# Patient Record
Sex: Female | Born: 1960
Health system: Southern US, Community
[De-identification: ages and names within clinical notes are randomized; demographics above are authoritative.]

## PROBLEM LIST (undated history)

## (undated) DIAGNOSIS — J189 Pneumonia, unspecified organism: Secondary | ICD-10-CM

## (undated) DIAGNOSIS — M1990 Unspecified osteoarthritis, unspecified site: Secondary | ICD-10-CM

## (undated) DIAGNOSIS — D649 Anemia, unspecified: Secondary | ICD-10-CM

## (undated) DIAGNOSIS — I80209 Phlebitis and thrombophlebitis of unspecified deep vessels of unspecified lower extremity: Secondary | ICD-10-CM

## (undated) DIAGNOSIS — I10 Essential (primary) hypertension: Secondary | ICD-10-CM

## (undated) DIAGNOSIS — J449 Chronic obstructive pulmonary disease, unspecified: Secondary | ICD-10-CM

## (undated) DIAGNOSIS — Z8669 Personal history of other diseases of the nervous system and sense organs: Secondary | ICD-10-CM

## (undated) HISTORY — DX: Personal history of other diseases of the nervous system and sense organs: Z86.69

## (undated) HISTORY — PX: CARPAL TUNNEL RELEASE: SHX101

## (undated) HISTORY — PX: OTHER SURGICAL HISTORY: SHX169

## (undated) HISTORY — PX: BLADDER SUSPENSION: SHX72

---

## 2016-05-22 ENCOUNTER — Ambulatory Visit (INDEPENDENT_AMBULATORY_CARE_PROVIDER_SITE_OTHER): Payer: PRIVATE HEALTH INSURANCE | Admitting: Obstetrics & Gynecology

## 2016-05-22 ENCOUNTER — Encounter: Payer: Self-pay | Admitting: Obstetrics & Gynecology

## 2016-05-22 VITALS — BP 136/90 | Ht 67.75 in | Wt 204.0 lb

## 2016-05-22 DIAGNOSIS — Z1151 Encounter for screening for human papillomavirus (HPV): Secondary | ICD-10-CM | POA: Diagnosis not present

## 2016-05-22 DIAGNOSIS — F17200 Nicotine dependence, unspecified, uncomplicated: Secondary | ICD-10-CM

## 2016-05-22 DIAGNOSIS — N898 Other specified noninflammatory disorders of vagina: Secondary | ICD-10-CM | POA: Diagnosis not present

## 2016-05-22 DIAGNOSIS — Z01411 Encounter for gynecological examination (general) (routine) with abnormal findings: Secondary | ICD-10-CM | POA: Diagnosis not present

## 2016-05-22 LAB — URINALYSIS W MICROSCOPIC + REFLEX CULTURE
Bilirubin Urine: NEGATIVE
CRYSTALS: NONE SEEN [HPF]
Casts: NONE SEEN [LPF]
Glucose, UA: NEGATIVE
Leukocytes, UA: NEGATIVE
Nitrite: NEGATIVE
Specific Gravity, Urine: 1.02 (ref 1.001–1.035)
YEAST: NONE SEEN [HPF]
pH: 5.5 (ref 5.0–8.0)

## 2016-05-22 LAB — WET PREP FOR TRICH, YEAST, CLUE
Clue Cells Wet Prep HPF POC: NONE SEEN
Trich, Wet Prep: NONE SEEN
Yeast Wet Prep HPF POC: NONE SEEN

## 2016-05-22 MED ORDER — TINIDAZOLE 500 MG PO TABS: 2.0000 g | ORAL_TABLET | Freq: Every day | ORAL | 0 refills | Status: AC

## 2016-05-22 NOTE — Addendum Note (Signed)
Addended by: Thurnell Garbe A on: 05/22/2016 11:50 AM   Modules accepted: Orders

## 2016-05-22 NOTE — Patient Instructions (Signed)
Your Annual/Gyn exam was normal today except for a Mild Bacterial Vaginosis.  Tindamax was sent to your pharmacy for treatment.  A Pap/HPV HR was done and I will give you the result as soon as available.  Please schedule your screening Mammo as soon as possible.  Congratulation on your plan to quit smoking and loosing weight. Anna Roth, it was a pleasure to meet you today!

## 2016-05-22 NOTE — Progress Notes (Signed)
Anna Roth 18-Nov-1960 [id number]   History:    56 y.o. G4P4L4  Married.  11 grand-children.  Moved from [place] to take care of her mom here who has dementia.  New patient presenting for annual gyn exam.  Menopause.  No HRT.  No PMB.  Mild hot flashes, tolerable.  No pelvic pain.  C/O vaginal d/c with itching and odor currently.  Mild SUI, Kegels PRN.  Breasts wnl.  BMI 31.3  Past medical history,surgical history, family history and social history were all reviewed and documented in the EPIC chart.  Gynecologic History No LMP recorded. Patient is postmenopausal. Contraception: post menopausal status Last Pap: 5 yrs ago. Results were: normal Last mammogram: 5 yrs ago. Results were: normal  Obstetric History OB History  Gravida Para Term Preterm AB Living  '4 4       4  '$ SAB TAB Ectopic Multiple Live Births               # Outcome Date GA Lbr Len/2nd Weight Sex Delivery Anes PTL Lv  4 Para           3 Para           2 Para           1 Para                ROS: A ROS was performed and pertinent positives and negatives are included in the history.  GENERAL: No fevers or chills. HEENT: No change in vision, no earache, sore throat or sinus congestion. NECK: No pain or stiffness. CARDIOVASCULAR: No chest pain or pressure. No palpitations. PULMONARY: No shortness of breath, cough or wheeze. GASTROINTESTINAL: No abdominal pain, nausea, vomiting or diarrhea, melena or bright red blood per rectum. GENITOURINARY: No urinary frequency, urgency, hesitancy or dysuria. MUSCULOSKELETAL: No joint or muscle pain, no back pain, no recent trauma. DERMATOLOGIC: No rash, no itching, no lesions. ENDOCRINE: No polyuria, polydipsia, no heat or cold intolerance. No recent change in weight. HEMATOLOGICAL: No anemia or easy bruising or bleeding. NEUROLOGIC: No headache, seizures, numbness, tingling or weakness. PSYCHIATRIC: No depression, no loss of interest in normal activity or change in sleep pattern.      Exam:   BP 136/90   Ht 5' 7.75" (1.721 m)   Wt 204 lb (92.5 kg)   BMI 31.25 kg/m   Body mass index is 31.25 kg/m.  General appearance : Well developed well nourished female. No acute distress HEENT: Eyes: no retinal hemorrhage or exudates,  Neck supple, trachea midline, no carotid bruits, no thyroidmegaly Lungs: Clear to auscultation, no rhonchi or wheezes, or rib retractions  Heart: Regular rate and rhythm, no murmurs or gallops Breast:Examined in sitting and supine position were symmetrical in appearance, no palpable masses or tenderness,  no skin retraction, no nipple inversion, no nipple discharge, no skin discoloration, no axillary or supraclavicular lymphadenopathy Abdomen: no palpable masses or tenderness, no rebound or guarding Extremities: no edema or skin discoloration or tenderness  Pelvic:  Bartholin, Urethra, Skene Glands: Within normal limits             Vagina: No gross lesions.  Vaginal discharge mildly increased, wet prep done.  Cervix: No gross lesions or discharge.  Pap done.    Uterus  AV, normal size, shape and consistency, non-tender and mobile  Adnexa  Without masses or tenderness  Anus and perineum  normal      Assessment/Plan:  56 y.o. female for annual exam  1. Encounter for gynecological examination with abnormal finding Normal Gyn exam except findings below.  Request to schedule Mammo given. - PAP,TP IMGw/HPV RNA,rflx [id number],57/26  2. Vaginal discharge Mild BV.  Tindamax treatment sent.  3. Morbid obesity ([place]) Low Carb, [employer] and physical activity reviewed.  4. Smoker Counseling done on importance of quitting.  Plans to quit in May 2018 when starts working.  5. Special screening examination for human papillomavirus (HPV)  - PAP,TP IMGw/HPV RNA,rflx [id number],63/84  Counseling on above issues >50% x 20 min.   Princess Bruins MD, 9:54 AM 05/22/2016

## 2016-05-24 LAB — URINE CULTURE

## 2016-05-26 LAB — PAP, TP IMAGING W/ HPV RNA, RFLX HPV TYPE 16,18/45: HPV mRNA, High Risk: NOT DETECTED

## 2016-05-27 ENCOUNTER — Ambulatory Visit (INDEPENDENT_AMBULATORY_CARE_PROVIDER_SITE_OTHER): Payer: PRIVATE HEALTH INSURANCE | Admitting: Physician Assistant

## 2016-05-27 ENCOUNTER — Encounter: Payer: Self-pay | Admitting: Physician Assistant

## 2016-05-27 VITALS — BP 130/84 | HR 82 | Temp 98.1°F | Resp 16 | Ht 67.5 in | Wt 203.2 lb

## 2016-05-27 DIAGNOSIS — Z789 Other specified health status: Secondary | ICD-10-CM

## 2016-05-27 DIAGNOSIS — Z1211 Encounter for screening for malignant neoplasm of colon: Secondary | ICD-10-CM

## 2016-05-27 DIAGNOSIS — Z13228 Encounter for screening for other metabolic disorders: Secondary | ICD-10-CM | POA: Diagnosis not present

## 2016-05-27 DIAGNOSIS — Z0000 Encounter for general adult medical examination without abnormal findings: Secondary | ICD-10-CM | POA: Diagnosis not present

## 2016-05-27 DIAGNOSIS — Z1159 Encounter for screening for other viral diseases: Secondary | ICD-10-CM

## 2016-05-27 DIAGNOSIS — Z113 Encounter for screening for infections with a predominantly sexual mode of transmission: Secondary | ICD-10-CM | POA: Diagnosis not present

## 2016-05-27 DIAGNOSIS — Z130 Encounter for screening for diseases of the blood and blood-forming organs and certain disorders involving the immune mechanism: Secondary | ICD-10-CM | POA: Diagnosis not present

## 2016-05-27 DIAGNOSIS — Z1329 Encounter for screening for other suspected endocrine disorder: Secondary | ICD-10-CM | POA: Diagnosis not present

## 2016-05-27 DIAGNOSIS — Z23 Encounter for immunization: Secondary | ICD-10-CM | POA: Diagnosis not present

## 2016-05-27 DIAGNOSIS — Z13220 Encounter for screening for lipoid disorders: Secondary | ICD-10-CM

## 2016-05-27 NOTE — Patient Instructions (Addendum)
IF you received an x-ray today, you will receive an invoice from [facility]. Please contact [facility] at [phone] with questions or concerns regarding your invoice.   IF you received labwork today, you will receive an invoice from [facility]. Please contact [facility] at [phone] with questions or concerns regarding your invoice.   Our billing staff will not be able to assist you with questions regarding bills from these companies.  You will be contacted with the lab results as soon as they are available. The fastest way to get your results is to activate your My Chart account. Instructions are located on the last page of this paperwork. If you have not heard from [place] regarding the results in 2 weeks, please contact this office.   Please hydrate well with 64oz of water if not more. You can try melatonin at night.  This is over the counter.  It can help with sleep.  Also exercise can help.  Taking a brisk walk for 30 minutes 4 times per week.    Please await contact for colonoscopy referral.   Keeping You Healthy  Get These Tests  Blood Pressure- Have your blood pressure checked by your healthcare provider at least once a year.  Normal blood pressure is 120/80.  Weight- Have your body mass index (BMI) calculated to screen for obesity.  BMI is a measure of body fat based on height and weight.  You can calculate your own BMI at [employer]velB[employer].it  Cholesterol- Have your cholesterol checked every year.  Diabetes- Have your blood sugar checked every year if you have high blood pressure, high cholesterol, a family history of diabetes or if you are overweight.  Pap Test - Have a pap test every 1 to 5 years if you have been sexually active.  If you are older than 65 and recent pap tests have been normal you may not need additional pap tests.  In addition, if you have had a hysterectomy  for benign disease additional pap tests are not  necessary.  Mammogram-Yearly mammograms are essential for early detection of breast cancer  Screening for Colon Cancer- Colonoscopy starting at age 25. Screening may begin sooner depending on your family history and other health conditions.  Follow up colonoscopy as directed by your Gastroenterologist.  Screening for Osteoporosis- Screening begins at age 28 with bone density scanning, sooner if you are at higher risk for developing Osteoporosis.  Get these medicines  Calcium with Vitamin D- Your body requires 1200-1500 mg of Calcium a day and [phone] IU of Vitamin D a day.  You can only absorb 500 mg of Calcium at a time therefore Calcium must be taken in 2 or 3 separate doses throughout the day.  Hormones- Hormone therapy has been associated with increased risk for certain cancers and heart disease.  Talk to your healthcare provider about if you need relief from menopausal symptoms.  Aspirin- Ask your healthcare provider about taking Aspirin to prevent Heart Disease and Stroke.  Get these Immuniztions  Flu shot- Every fall  Pneumonia shot- Once after the age of 58; if you are younger ask your healthcare provider if you need a pneumonia shot.  Tetanus- Every ten years.  Zostavax- Once after the age of 62 to prevent shingles.  Take these steps  Don't smoke- Your healthcare provider can help you quit. For tips on how to quit, ask your healthcare provider or go to www.smokefree.gov or call 1-800 QUIT-NOW.  Be physically active- Exercise 5 days a  week for a minimum of 30 minutes.  If you are not already physically active, start slow and gradually work up to 30 minutes of moderate physical activity.  Try walking, dancing, bike riding, swimming, etc.  Eat a healthy diet- Eat a variety of healthy foods such as fruits, vegetables, whole grains, low fat milk, low fat cheeses, yogurt, lean meats, chicken, fish, eggs, dried beans, tofu, etc.  For more information go to  www.thenutritionsource.org  Dental visit- Brush and floss teeth twice daily; visit your dentist twice a year.  Eye exam- Visit your Optometrist or Ophthalmologist yearly.  Drink alcohol in moderation- Limit alcohol intake to one drink or less a day.  Never drink and drive.  Depression- Your emotional health is as important as your physical health.  If you're feeling down or losing interest in things you normally enjoy, please talk to your healthcare provider.  Seat Belts- can save your life; always wear one  Smoke/Carbon Monoxide detectors- These detectors need to be installed on the appropriate level of your home.  Replace batteries at least once a year.  Violence- If anyone is threatening or hurting you, please tell your healthcare provider.  Living Will/ Health care power of attorney- Discuss with your healthcare provider and family.

## 2016-05-27 NOTE — Progress Notes (Signed)
PRIMARY CARE AT [facility] [address] [place] [address] [phone]  Date:  05/27/2016   Name:  Anna Roth   DOB:  Jul 24, 1960   MRN:  [id number]  PCP:  No PCP Per Patient    History of Present Illness:  Anna Roth is a 56 y.o. female patient who presents to PCP with  Chief Complaint  Patient presents with  . Annual Exam    varicella titer, seen GYN last wk, form to fill out     --DIET: she may eat hotdogs, avoids fried foods, salads.  Vegetables in daily; peas, carrots, green beans, spinach  --BOWEL MOVEMENTS: irregular, can not hold bowels, seems like she is not going at all, but has soft stools cup of coffee.  No blood or black stool.  She has noticed a white discharge like fat.  This has started for the last 2 months.  No fevers.    --URINATION: no frequency, hematuria, or dysuria  --colonoscopy in [place] likely 5 years ago.  Polyps.    --she has a hx of high cholesterol.  Was taking atorvastatin, 67m.  --SLEEP: she has a lot on her mind, so she is having difficulty sleeping 1am-4am.    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  She likes to golf for fun.  Go for long rides, and reading are also fun for her.   Exercise: none at this time.  --etOH: glass of wine daily, and cocktail weekends --tobacco or vaping: quit for 5 years, quit date is 57/05/1023   --illicit drug use: none  --she was told that she had a possible goiter.    There are no active problems to display for this patient.   Past Medical History:  Diagnosis Date  . H/O: Bell's palsy     Past Surgical History:  Procedure Laterality Date  . BLADDER SUSPENSION    . CARPAL TUNNEL RELEASE Bilateral   . neck fusion     x4    Social History  Substance Use Topics  . Smoking status: Current Every Day Smoker    Packs/day: 0.50  . Smokeless tobacco: Never Used  . Alcohol use Yes     Comment: glass of wine with dinner    Family History   Problem Relation Age of Onset  . Diabetes Mother   . Stroke Sister   . Cancer Paternal Aunt     stomach  . Cancer Maternal Grandmother     stomach  . Cancer Paternal Grandmother     stomach     No Known Allergies  Medication list has been reviewed and updated.  No current outpatient prescriptions on file prior to visit.   No current facility-administered medications on file prior to visit.     Review of Systems  Constitutional: Negative for chills and fever.  HENT: Negative for ear discharge, ear pain and sore throat.   Eyes: Negative for blurred vision and double vision.  Respiratory: Negative for cough, shortness of breath and wheezing.   Cardiovascular: Negative for chest pain, palpitations and leg swelling.  Gastrointestinal: Negative for diarrhea, nausea and vomiting.  Genitourinary: Negative for dysuria, frequency and hematuria.  Skin: Negative for itching and rash.  Neurological: Negative for dizziness and headaches.   ROS otherwise unremarkable unless listed above.  Physical Examination: BP 130/84 (BP Location: Left Arm, Cuff Size: Large)   Pulse 82   Temp 98.1 F (36.7 C) (Oral)   Resp 16  Ht 5' 7.5" (1.715 m)   Wt 203 lb 3.2 oz (92.2 kg)   SpO2 100%   BMI 31.36 kg/m  Ideal Body Weight: Weight in (lb) to have BMI = 25: 161.7  Physical Exam  Constitutional: She is oriented to person, place, and time. She appears well-developed and well-nourished. No distress.  HENT:  Head: Normocephalic and atraumatic.  Right Ear: Tympanic membrane, external ear and ear canal normal.  Left Ear: Tympanic membrane, external ear and ear canal normal.  Nose: Right sinus exhibits no maxillary sinus tenderness and no frontal sinus tenderness. Left sinus exhibits no maxillary sinus tenderness and no frontal sinus tenderness.  Mouth/Throat: Oropharynx is clear and moist. No uvula swelling. No oropharyngeal exudate, posterior oropharyngeal edema or posterior oropharyngeal  erythema.  Eyes: Conjunctivae and EOM are normal. Pupils are equal, round, and reactive to light.  Neck: Normal range of motion. Neck supple. No thyromegaly present.  Cardiovascular: Normal rate, regular rhythm, normal heart sounds and intact distal pulses.  Exam reveals no gallop, no distant heart sounds and no friction rub.   No murmur heard. Pulmonary/Chest: Effort normal and breath sounds normal. No respiratory distress. She has no decreased breath sounds. She has no wheezes. She has no rhonchi.  Abdominal: Soft. Bowel sounds are normal. She exhibits no distension and no mass. There is no tenderness.  Musculoskeletal: Normal range of motion. She exhibits no edema or tenderness.  Lymphadenopathy:       Head (right side): No submandibular, no tonsillar, no preauricular and no posterior auricular adenopathy present.       Head (left side): No submandibular, no tonsillar, no preauricular and no posterior auricular adenopathy present.    She has no cervical adenopathy.  Neurological: She is alert and oriented to person, place, and time. No cranial nerve deficit. She exhibits normal muscle tone. Coordination normal.  Skin: Skin is warm and dry. She is not diaphoretic.  Psychiatric: She has a normal mood and affect. Her behavior is normal.     Assessment and Plan: Anna Roth is a 56 y.o. female who is here today for annual physical exam, form completion, and establish care. She will return for her hep b and in 6 months from 05/06/2016 for 3rd dosing. Referral for colonoscopy performed today.  Annual physical exam - Plan: CBC, CMP14+EGFR, Lipid panel, RPR, HIV antibody, Hepatitis C antibody, Varicella Zoster Abs, IgG/IgM, TSH, T4, Free, Ambulatory referral to Gastroenterology, Hepatitis B vaccine adult IM, CANCELED: Hepatitis B vaccine adult IM  Screening for deficiency anemia - Plan: CBC  Screening for metabolic disorder - Plan: CMP14+EGFR  Screening for lipid disorders - Plan: Lipid  panel  Screening for STD (sexually transmitted disease) - Plan: RPR, HIV antibody, Hepatitis C antibody  Varicella vaccination status unknown - Plan: Varicella Zoster Abs, IgG/IgM  Screening for thyroid disorder - Plan: TSH, T4, Free  Need for hepatitis C screening test - Plan: Hepatitis C antibody  Special screening for malignant neoplasms, colon - Plan: Ambulatory referral to Gastroenterology  Need for hepatitis B vaccination - Plan: Hepatitis B vaccine adult IM, CANCELED: Hepatitis B vaccine adult IM  Ivar Drape, PA-C [facility] 5/2/20181:50 PM

## 2016-05-28 LAB — CMP14+EGFR
A/G RATIO: 1.6 (ref 1.2–2.2)
ALT: 19 IU/L (ref 0–32)
AST: 17 IU/L (ref 0–40)
Albumin: 4.7 g/dL (ref 3.5–5.5)
Alkaline Phosphatase: 106 IU/L (ref 39–117)
BUN/Creatinine Ratio: 12 (ref 9–23)
BUN: 9 mg/dL (ref 6–24)
Bilirubin Total: 0.3 mg/dL (ref 0.0–1.2)
CALCIUM: 10 mg/dL (ref 8.7–10.2)
CHLORIDE: 100 mmol/L (ref 96–106)
CO2: 22 mmol/L (ref 18–29)
Creatinine, Ser: 0.74 mg/dL (ref 0.57–1.00)
GFR calc Af Amer: 105 mL/min/{1.73_m2} (ref >59)
GFR calc non Af Amer: 91 mL/min/{1.73_m2} (ref >59)
GLUCOSE: 93 mg/dL (ref 65–99)
Globulin, Total: 3 g/dL (ref 1.5–4.5)
POTASSIUM: 4.1 mmol/L (ref 3.5–5.2)
Sodium: 140 mmol/L (ref 134–144)
Total Protein: 7.7 g/dL (ref 6.0–8.5)

## 2016-05-28 LAB — VARICELLA ZOSTER ABS, IGG/IGM: Varicella zoster IgG: 972 index (ref >165)

## 2016-05-28 LAB — CBC
Hematocrit: 46.7 % — ABNORMAL HIGH (ref 34.0–46.6)
Hemoglobin: 15.4 g/dL (ref 11.1–15.9)
MCH: 27.2 pg (ref 26.6–33.0)
MCHC: 33 g/dL (ref 31.5–35.7)
MCV: 82 fL (ref 79–97)
PLATELETS: 248 10*3/uL (ref 150–379)
RBC: 5.67 x10E6/uL — ABNORMAL HIGH (ref 3.77–5.28)
RDW: 14.6 % (ref 12.3–15.4)
WBC: 8.9 10*3/uL (ref 3.4–10.8)

## 2016-05-28 LAB — LIPID PANEL
Chol/HDL Ratio: 6.2 ratio — ABNORMAL HIGH (ref 0.0–4.4)
Cholesterol, Total: 354 mg/dL — ABNORMAL HIGH (ref 100–199)
HDL: 57 mg/dL (ref >39)
LDL Calculated: 252 mg/dL — ABNORMAL HIGH (ref 0–99)
TRIGLYCERIDES: 225 mg/dL — AB (ref 0–149)
VLDL CHOLESTEROL CAL: 45 mg/dL — AB (ref 5–40)

## 2016-05-28 LAB — TSH: TSH: 2.03 u[IU]/mL (ref 0.450–4.500)

## 2016-05-28 LAB — HIV ANTIBODY (ROUTINE TESTING W REFLEX): HIV Screen 4th Generation wRfx: NONREACTIVE

## 2016-05-28 LAB — RPR: RPR Ser Ql: NONREACTIVE

## 2016-05-28 LAB — HEPATITIS C ANTIBODY

## 2016-05-28 LAB — T4, FREE: FREE T4: 1.32 ng/dL (ref 0.82–1.77)

## 2016-05-29 ENCOUNTER — Telehealth: Payer: Self-pay | Admitting: Physician Assistant

## 2016-05-29 NOTE — Telephone Encounter (Signed)
Pt has brought in paperwork for [place] [place] to fill out. I have put in nurse's box at [phone]. Please advise pt when ready.

## 2016-05-29 NOTE — Telephone Encounter (Signed)
Filled out and up front for pick up Copy sent to scan I called patient but no answer

## 2016-06-04 ENCOUNTER — Other Ambulatory Visit (INDEPENDENT_AMBULATORY_CARE_PROVIDER_SITE_OTHER): Payer: PRIVATE HEALTH INSURANCE | Admitting: Urgent Care

## 2016-06-04 DIAGNOSIS — Z23 Encounter for immunization: Secondary | ICD-10-CM

## 2016-06-04 NOTE — Patient Instructions (Signed)
Patient came in today for her 2nd Hepatitis B vaccine. It was given in Left deltoid.

## 2016-08-29 ENCOUNTER — Encounter: Payer: Self-pay | Admitting: Physician Assistant

## 2017-04-28 ENCOUNTER — Encounter: Payer: Self-pay | Admitting: Physician Assistant

## 2019-03-18 ENCOUNTER — Ambulatory Visit: Payer: Self-pay

## 2019-04-11 ENCOUNTER — Encounter (HOSPITAL_COMMUNITY): Payer: Self-pay | Admitting: Emergency Medicine

## 2019-04-11 ENCOUNTER — Emergency Department (HOSPITAL_COMMUNITY)
Admission: EM | Admit: 2019-04-11 | Discharge: 2019-04-11 | Disposition: A | Discharge status: Home / Self Care | Payer: BC Managed Care – PPO | Attending: Emergency Medicine | Admitting: Emergency Medicine

## 2019-04-11 ENCOUNTER — Other Ambulatory Visit: Payer: Self-pay

## 2019-04-11 ENCOUNTER — Emergency Department (HOSPITAL_COMMUNITY): Payer: BC Managed Care – PPO

## 2019-04-11 DIAGNOSIS — F17210 Nicotine dependence, cigarettes, uncomplicated: Secondary | ICD-10-CM | POA: Insufficient documentation

## 2019-04-11 DIAGNOSIS — J441 Chronic obstructive pulmonary disease with (acute) exacerbation: Secondary | ICD-10-CM | POA: Diagnosis not present

## 2019-04-11 DIAGNOSIS — Z20822 Contact with and (suspected) exposure to covid-19: Secondary | ICD-10-CM | POA: Diagnosis not present

## 2019-04-11 DIAGNOSIS — R0602 Shortness of breath: Secondary | ICD-10-CM | POA: Diagnosis present

## 2019-04-11 LAB — CBC
HCT: 44.1 % (ref 36.0–46.0)
Hemoglobin: 14.3 g/dL (ref 12.0–15.0)
MCH: 28.3 pg (ref 26.0–34.0)
MCHC: 32.4 g/dL (ref 30.0–36.0)
MCV: 87.2 fL (ref 80.0–100.0)
Platelets: 219 10*3/uL (ref 150–400)
RBC: 5.06 MIL/uL (ref 3.87–5.11)
RDW: 13.3 % (ref 11.5–15.5)
WBC: 10.8 10*3/uL — ABNORMAL HIGH (ref 4.0–10.5)
nRBC: 0 % (ref 0.0–0.2)

## 2019-04-11 LAB — BASIC METABOLIC PANEL
Anion gap: 12 (ref 5–15)
BUN: 11 mg/dL (ref 6–20)
CO2: 27 mmol/L (ref 22–32)
Calcium: 9.6 mg/dL (ref 8.9–10.3)
Chloride: 104 mmol/L (ref 98–111)
Creatinine, Ser: 0.68 mg/dL (ref 0.44–1.00)
GFR calc Af Amer: >60 mL/min (ref >60)
GFR calc non Af Amer: >60 mL/min (ref >60)
Glucose, Bld: 97 mg/dL (ref 70–99)
Potassium: 3.8 mmol/L (ref 3.5–5.1)
Sodium: 143 mmol/L (ref 135–145)

## 2019-04-11 LAB — POC SARS CORONAVIRUS 2 AG -  ED: SARS Coronavirus 2 Ag: NEGATIVE

## 2019-04-11 LAB — SARS CORONAVIRUS 2 (TAT 6-24 HRS): SARS Coronavirus 2: NEGATIVE

## 2019-04-11 LAB — D-DIMER, QUANTITATIVE: D-Dimer, Quant: 0.35 ug/mL-FEU (ref 0.00–0.50)

## 2019-04-11 MED ORDER — PREDNISONE 10 MG PO TABS: 20.0000 mg | ORAL_TABLET | Freq: Every day | ORAL | 0 refills | Status: AC

## 2019-04-11 MED ORDER — PREDNISONE 10 MG PO TABS: 20.0000 mg | ORAL_TABLET | Freq: Every day | ORAL | 0 refills | Status: DC

## 2019-04-11 NOTE — ED Provider Notes (Signed)
[facility] DEPT Provider Note   CSN: [id number] Arrival date & time: 04/11/19  1012     History Chief Complaint  Patient presents with  . Shortness of Breath    Anna Roth is a 59 y.o. female.  HPI 59 year old female smoker, COPD, recent diagnosis of pneumonia on March 4 presents today complaining of ongoing dyspnea and cough.  She states she was seen by her primary care physician in the office on March 4.  At that time she was told she had some rales at the left base and had a chest x-Mehdi Gironda with a left lower lobe infiltrate.  She has been on doxycycline since implant.  She states that she has continued to cough and be short of breath.  She reports that she has had some wheezing.  She has been using albuterol 2-3 times per day.  She was not placed on any steroids.  She has not had fever, chills, nausea, vomiting, or diarrhea.  She has not been tested for Covid.  She works in a Magazine features editor.  She has a history of DVT that was provoked several years ago.  She is no longer on any blood thinners and has had no recurrence.  She denies any symptoms of DVT at this time.    Past Medical History:  Diagnosis Date  . H/O: Bell's palsy     There are no problems to display for this patient.   Past Surgical History:  Procedure Laterality Date  . BLADDER SUSPENSION    . CARPAL TUNNEL RELEASE Bilateral   . neck fusion     x4     OB History    Gravida  4   Para  4   Term      Preterm      AB      Living  4     SAB      TAB      Ectopic      Multiple      Live Births              Family History  Problem Relation Age of Onset  . Diabetes Mother   . Stroke Sister   . Cancer Paternal Aunt        stomach  . Cancer Maternal Grandmother        stomach  . Cancer Paternal Grandmother        stomach     Social History   Tobacco Use  . Smoking status: Current Every Day Smoker    Packs/day: 0.50  . Smokeless tobacco: Never Used    Substance Use Topics  . Alcohol use: Yes    Comment: glass of wine with dinner  . Drug use: Not on file    Home Medications Prior to Admission medications   Not on File    Allergies    Patient has no known allergies.  Review of Systems   Review of Systems  Constitutional: Negative.   HENT: Negative.   Eyes: Negative.   Respiratory: Positive for cough and shortness of breath.   Cardiovascular: Negative.  Negative for leg swelling.  Gastrointestinal: Negative.   All other systems reviewed and are negative.   Physical Exam Updated Vital Signs Pulse 82   Resp 18   Ht 1.702 m (5\' 7" )   Wt 83.5 kg   SpO2 99%   BMI 28.82 kg/m   Physical Exam Vitals reviewed.  Constitutional:      Appearance: She  is well-developed.  HENT:     Head: Normocephalic and atraumatic.     Mouth/Throat:     Mouth: Mucous membranes are moist.  Eyes:     Pupils: Pupils are equal, round, and reactive to light.  Cardiovascular:     Rate and Rhythm: Normal rate and regular rhythm.  Pulmonary:     Effort: Pulmonary effort is normal.     Breath sounds: Examination of the left-middle field reveals rhonchi. Examination of the left-lower field reveals rhonchi. Rhonchi present. No decreased breath sounds, wheezing or rales.  Chest:     Chest wall: No mass.  Abdominal:     Palpations: Abdomen is soft.  Musculoskeletal:        General: Normal range of motion.     Cervical back: Normal range of motion and neck supple.     Right lower leg: No tenderness. No edema.     Left lower leg: No tenderness. No edema.  Skin:    General: Skin is warm and dry.     Capillary Refill: Capillary refill takes less than 2 seconds.  Neurological:     General: No focal deficit present.     Mental Status: She is alert.  Psychiatric:        Mood and Affect: Mood normal.        Behavior: Behavior normal.     ED Results / Procedures / Treatments   Labs (all labs ordered are listed, but only abnormal results are  displayed) Labs Reviewed - No data to display  EKG None  Radiology No results found.  Procedures Procedures (including critical care time)  Medications Ordered in ED Medications - No data to display  ED Course  I have reviewed the triage vital signs and the nursing notes.  Pertinent labs & imaging results that were available during my care of the patient were reviewed by me and considered in my medical decision making (see chart for details).    MDM Rules/Calculators/A&P                      Patient with copd, recent pneumonia diagnosis.  CXR clearing here.  No evidence of worsening infiltrate.  Patient with clear lungs but reports recent wheezing.  Also, ho dvt- d-dimer done and negative-doubt pe. Alvie Speltz was evaluated in Emergency Department on 04/11/2019 for the symptoms described in the history of present illness. She was evaluated in the context of the global COVID-19 pandemic, which necessitated consideration that the patient might be at risk for infection with the SARS-CoV-2 virus that causes COVID-19. Institutional protocols and algorithms that pertain to the evaluation of patients at risk for COVID-19 are in a state of rapid change based on information released by regulatory bodies including the CDC and federal and state organizations. These policies and algorithms were followed during the patient's care in the ED.  POC covid negative, pcr test pending.  Plan d/c with short course of steroids. Final Clinical Impression(s) / ED Diagnoses Final diagnoses:  COPD exacerbation [facility])    Rx / DC Orders ED Discharge Orders    None       Pattricia Boss, MD 04/11/19 1154

## 2019-04-11 NOTE — Discharge Instructions (Addendum)
Please continue albuterol puffs to every 4 hours Take prednisone as prescribed Covid test is pending please check in my chart.  Please stay home until you have had the results of this test. There is no evidence of worsening pneumonia on x-Gustavo Meditz.  Test for blood clots are negative. Please follow-up with your doctor in the next 4 to 5 days Please return the emergency department if you are worse anytime.

## 2019-04-11 NOTE — ED Triage Notes (Signed)
Arrives C/C SOB and chest pain intermittent, dx with PNA 9 days ago, has been taking doxycycline for this. Endorses productive cough, flem with brown specks.

## 2019-10-19 ENCOUNTER — Other Ambulatory Visit: Payer: Self-pay

## 2019-10-19 ENCOUNTER — Other Ambulatory Visit: Payer: BC Managed Care – PPO

## 2019-10-19 DIAGNOSIS — Z20822 Contact with and (suspected) exposure to covid-19: Secondary | ICD-10-CM

## 2019-10-20 LAB — NOVEL CORONAVIRUS, NAA: SARS-CoV-2, NAA: NOT DETECTED

## 2019-10-20 LAB — SARS-COV-2, NAA 2 DAY TAT

## 2019-11-07 DIAGNOSIS — R140 Abdominal distension (gaseous): Secondary | ICD-10-CM | POA: Insufficient documentation

## 2019-12-01 DIAGNOSIS — E7800 Pure hypercholesterolemia, unspecified: Secondary | ICD-10-CM | POA: Insufficient documentation

## 2020-01-20 ENCOUNTER — Other Ambulatory Visit: Payer: Self-pay

## 2020-01-20 ENCOUNTER — Encounter (HOSPITAL_COMMUNITY): Payer: Self-pay | Admitting: *Deleted

## 2020-01-20 ENCOUNTER — Emergency Department (HOSPITAL_COMMUNITY)
Admission: EM | Admit: 2020-01-20 | Discharge: 2020-01-20 | Disposition: A | Discharge status: Home / Self Care | Payer: BC Managed Care – PPO | Attending: Emergency Medicine | Admitting: Emergency Medicine

## 2020-01-20 ENCOUNTER — Emergency Department (HOSPITAL_COMMUNITY): Payer: BC Managed Care – PPO

## 2020-01-20 ENCOUNTER — Emergency Department (HOSPITAL_BASED_OUTPATIENT_CLINIC_OR_DEPARTMENT_OTHER): Payer: BC Managed Care – PPO

## 2020-01-20 DIAGNOSIS — M7989 Other specified soft tissue disorders: Secondary | ICD-10-CM | POA: Diagnosis not present

## 2020-01-20 DIAGNOSIS — M549 Dorsalgia, unspecified: Secondary | ICD-10-CM

## 2020-01-20 DIAGNOSIS — M5442 Lumbago with sciatica, left side: Secondary | ICD-10-CM | POA: Insufficient documentation

## 2020-01-20 DIAGNOSIS — G8929 Other chronic pain: Secondary | ICD-10-CM | POA: Diagnosis not present

## 2020-01-20 DIAGNOSIS — R0602 Shortness of breath: Secondary | ICD-10-CM

## 2020-01-20 DIAGNOSIS — R109 Unspecified abdominal pain: Secondary | ICD-10-CM | POA: Insufficient documentation

## 2020-01-20 DIAGNOSIS — F17200 Nicotine dependence, unspecified, uncomplicated: Secondary | ICD-10-CM | POA: Diagnosis not present

## 2020-01-20 DIAGNOSIS — M79604 Pain in right leg: Secondary | ICD-10-CM | POA: Insufficient documentation

## 2020-01-20 DIAGNOSIS — M545 Low back pain, unspecified: Secondary | ICD-10-CM

## 2020-01-20 HISTORY — DX: Phlebitis and thrombophlebitis of unspecified deep vessels of unspecified lower extremity: I80.209

## 2020-01-20 LAB — BASIC METABOLIC PANEL
Anion gap: 11 (ref 5–15)
BUN: 12 mg/dL (ref 6–20)
CO2: 25 mmol/L (ref 22–32)
Calcium: 9.4 mg/dL (ref 8.9–10.3)
Chloride: 106 mmol/L (ref 98–111)
Creatinine, Ser: 0.63 mg/dL (ref 0.44–1.00)
GFR, Estimated: >60 mL/min (ref >60)
Glucose, Bld: 81 mg/dL (ref 70–99)
Potassium: 4.2 mmol/L (ref 3.5–5.1)
Sodium: 142 mmol/L (ref 135–145)

## 2020-01-20 LAB — URINALYSIS, ROUTINE W REFLEX MICROSCOPIC
Bilirubin Urine: NEGATIVE
Glucose, UA: NEGATIVE mg/dL
Ketones, ur: NEGATIVE mg/dL
Leukocytes,Ua: NEGATIVE
Nitrite: NEGATIVE
Protein, ur: 30 mg/dL — AB
Specific Gravity, Urine: 1.02 (ref 1.005–1.030)
pH: 5 (ref 5.0–8.0)

## 2020-01-20 MED ORDER — PREDNISONE 20 MG PO TABS: 40.0000 mg | ORAL_TABLET | Freq: Every day | ORAL | 0 refills | Status: AC

## 2020-01-20 NOTE — ED Provider Notes (Signed)
[facility] DEPT Provider Note   CSN: [id number] Arrival date & time: 01/20/20  6578     History Chief Complaint  Patient presents with  . Leg Pain    Anna Roth is a 59 y.o. female.  HPI   Patient with significant history of Bell's palsy, DVTs presents to the emergency with complaint of lower back pain and right leg pain.  Patient states she has had right leg pain for the last 2 months but on Wednesday the pain got much worse.  Patient explains leg worsens by the end of the day worse in the day, feels rating pain down her right leg.  She denies leg paresthesias or weakness in lower extremities, denies urinary retention, urinary difficulty, difficulty with bowel movements.  Patient denies recent traumas to the area, has no history of back issues.  Patient endorses that she is concerned for possible DVT in her leg as she has a history of one, she  is concerned that she may have a kidney stone as she had noted some blood in her urine.  She denies urinary symptoms, dysuria, flank pain, bleeding.  She has no history of kidney stones.  Patient denies  alleviating factors.  Patient denies headaches, fevers, chills, shortness breath, chest pain, abdominal pain, urinary symptoms, pedal edema  Past Medical History:  Diagnosis Date  . Deep vein thrombophlebitis of leg (HCC)   . H/O: Bell's palsy     There are no problems to display for this patient.   Past Surgical History:  Procedure Laterality Date  . BLADDER SUSPENSION    . CARPAL TUNNEL RELEASE Bilateral   . neck fusion     x4     OB History    Gravida  4   Para  4   Term      Preterm      AB      Living  4     SAB      IAB      Ectopic      Multiple      Live Births              Family History  Problem Relation Age of Onset  . Diabetes Mother   . Stroke Sister   . Cancer Paternal Aunt        stomach  . Cancer Maternal Grandmother        stomach  . Cancer Paternal  Grandmother        stomach     Social History   Tobacco Use  . Smoking status: Current Every Day Smoker    Packs/day: 0.50  . Smokeless tobacco: Never Used  Vaping Use  . Vaping Use: Never used  Substance Use Topics  . Alcohol use: Yes    Comment: glass of wine with dinner  . Drug use: Never    Home Medications Prior to Admission medications   Medication Sig Start Date End Date Taking? Authorizing Provider  predniSONE (DELTASONE) 20 MG tablet Take 2 tablets (40 mg total) by mouth daily for 5 days. 01/20/20 01/25/20  Marcello Fennel, PA-C    Allergies    Patient has no known allergies.  Review of Systems   Review of Systems  Constitutional: Negative for chills and fever.  HENT: Negative for congestion.   Respiratory: Negative for cough and shortness of breath.   Cardiovascular: Negative for chest pain.  Gastrointestinal: Negative for abdominal pain, diarrhea, nausea and vomiting.  Genitourinary:  Positive for hematuria. Negative for dysuria, enuresis, flank pain and vaginal bleeding.  Musculoskeletal: Positive for back pain.  Skin: Negative for rash.  Neurological: Negative for headaches.  Hematological: Does not bruise/bleed easily.    Physical Exam Updated Vital Signs BP (!) 147/78   Pulse 68   Resp 16   Ht 5\' 7"  (1.702 m)   Wt 82.6 kg   SpO2 100%   BMI 28.51 kg/m   Physical Exam Vitals and nursing note reviewed.  Constitutional:      General: She is not in acute distress.    Appearance: She is not ill-appearing.  HENT:     Head: Normocephalic and atraumatic.     Nose: No congestion.  Eyes:     Conjunctiva/sclera: Conjunctivae normal.  Cardiovascular:     Rate and Rhythm: Normal rate and regular rhythm.     Pulses: Normal pulses.     Heart sounds: No murmur heard. No friction rub. No gallop.   Pulmonary:     Effort: No respiratory distress.     Breath sounds: No wheezing, rhonchi or rales.  Abdominal:     Palpations: Abdomen is soft.      Tenderness: There is no abdominal tenderness. There is no right CVA tenderness or left CVA tenderness.  Musculoskeletal:     Cervical back: Normal range of motion. No tenderness.     Right lower leg: No edema.     Left lower leg: No edema.     Comments: Patient spine was palpated she had noted paraspinal tenderness along her thoracic and lumbar spine.  There is no step-off or crepitus noted.  She had a positive straight leg exam.  Patient is moving all 4 extremities at difficulty.  Skin:    General: Skin is warm and dry.  Neurological:     Mental Status: She is alert.  Psychiatric:        Mood and Affect: Mood normal.     ED Results / Procedures / Treatments   Labs (all labs ordered are listed, but only abnormal results are displayed) Labs Reviewed  URINALYSIS, ROUTINE W REFLEX MICROSCOPIC - Abnormal; Notable for the following components:      Result Value   Hgb urine dipstick SMALL (*)    Protein, ur 30 (*)    Bacteria, UA RARE (*)    All other components within normal limits  BASIC METABOLIC PANEL    EKG None  Radiology CT Thoracic Spine Wo Contrast  Result Date: 01/20/2020 CLINICAL DATA:  Mid back pain.  Leg weakness. EXAM: CT THORACIC SPINE WITHOUT CONTRAST TECHNIQUE: Multidetector CT images of the thoracic were obtained using the standard protocol without intravenous contrast. COMPARISON:  None. FINDINGS: Alignment: Normal Vertebrae: Negative for vertebral fracture or mass. Paraspinal and other soft tissues: No paraspinous mass or adenopathy. Visualized lungs clear. Disc levels: ACDF with plate and screws at N0-5. Anatomy above C7 not imaged. Disc degeneration with endplate spurring is present at multiple levels extending from T5 through T10. No significant spinal or foraminal stenosis at these levels. There is left foraminal narrowing at T3-4 due to endplate spurring and facet hypertrophy. Mild facet degeneration is present in the thoracic spine. IMPRESSION: No acute  abnormality.  Negative for fracture or mass. Multilevel disc degeneration and mild spurring throughout the thoracic spine. No significant spinal canal stenosis. Left foraminal narrowing T3-4. Electronically Signed   By: Franchot Gallo M.D.   On: 01/20/2020 12:26   CT L-SPINE NO CHARGE  Result Date: 01/20/2020  CLINICAL DATA:  Back pain.  Legs gave out.  Hematuria. EXAM: CT LUMBAR SPINE WITHOUT CONTRAST TECHNIQUE: Multidetector CT imaging of the lumbar spine was performed without intravenous contrast administration. Multiplanar CT image reconstructions were also generated. COMPARISON:  None. FINDINGS: Segmentation: Normal Alignment: Normal Vertebrae: Negative for fracture or mass. No evidence of spinal infection. Paraspinal and other soft tissues: Negative for paraspinous mass or adenopathy. Atherosclerotic calcification aorta and iliacs without aneurysmal dilatation. Disc levels: L1-2: Mild disc bulging and moderate facet degeneration. No significant stenosis. L2-3: Diffuse bulging of the disc and moderate to advanced facet degeneration. Mild spinal stenosis. Moderate right foraminal stenosis and mild left foraminal stenosis. L3-4: Moderate disc bulging and severe facet hypertrophy bilaterally. Moderate to severe spinal stenosis. Moderate subarticular and foraminal stenosis bilaterally. L4-5: Moderate bulging of the disc with advanced facet degeneration bilaterally. Moderate spinal stenosis . Mild to moderate subarticular stenosis bilaterally. L5-S1: Mild disc degeneration. Severe facet hypertrophy on the right causing right subarticular and foraminal stenosis. Mild facet degeneration on the left. IMPRESSION: Multilevel degenerative change in the lumbar spine primarily involving the facet joints causing spinal and foraminal stenosis as described above Negative for fracture. Electronically Signed   By: Franchot Gallo M.D.   On: 01/20/2020 12:31   CT Renal Stone Study  Result Date: 01/20/2020 CLINICAL DATA:   Right-sided flank pain and hematuria EXAM: CT ABDOMEN AND PELVIS WITHOUT CONTRAST TECHNIQUE: Multidetector CT imaging of the abdomen and pelvis was performed following the standard protocol without IV contrast. COMPARISON:  None. FINDINGS: Lower chest: No acute abnormality. Hepatobiliary: No focal liver abnormality is seen. No gallstones, gallbladder wall thickening, or biliary dilatation. Pancreas: Unremarkable. No pancreatic ductal dilatation or surrounding inflammatory changes. Spleen: Normal in size without focal abnormality. Adrenals/Urinary Tract: Adrenal glands are within normal limits. Kidneys are well visualized bilaterally. No renal calculi or ureteral calculi are noted. Bladder is decompressed. Stomach/Bowel: The appendix is within normal limits. Scattered diverticular change of the colon is noted without evidence of diverticulitis. Small bowel and stomach are within normal limits. Vascular/Lymphatic: Aortic atherosclerosis. No enlarged abdominal or pelvic lymph nodes. Reproductive: Uterus and bilateral adnexa are unremarkable. Other: No abdominal wall hernia or abnormality. No abdominopelvic ascites. Musculoskeletal: Degenerative changes of lumbar spine are noted. No acute abnormality is noted. IMPRESSION: No renal calculi or obstructive changes are noted. Diverticulosis without diverticulitis. Electronically Signed   By: Inez Catalina M.D.   On: 01/20/2020 12:06   VAS [place] LOWER EXTREMITY VENOUS (DVT) (ONLY [facility] & [facility])  Result Date: 01/20/2020  Lower Venous DVT Study Indications: Swelling, and SOB.  Comparison Study: No previous exam Performing Technologist: Vonzell Schlatter RVT  Examination Guidelines: A complete evaluation includes B-mode imaging, spectral Doppler, color Doppler, and power Doppler as needed of all accessible portions of each vessel. Bilateral testing is considered an integral part of a complete examination. Limited examinations for reoccurring indications may be performed as noted. The  reflux portion of the exam is performed with the patient in reverse Trendelenburg.  +---------+---------------+---------+-----------+----------+--------------+ RIGHT    CompressibilityPhasicitySpontaneityPropertiesThrombus Aging +---------+---------------+---------+-----------+----------+--------------+ CFV      Full           Yes      Yes                                 +---------+---------------+---------+-----------+----------+--------------+ SFJ      Full                                                        +---------+---------------+---------+-----------+----------+--------------+  FV Prox  Full                                                        +---------+---------------+---------+-----------+----------+--------------+ FV Mid   Full                                                        +---------+---------------+---------+-----------+----------+--------------+ FV DistalFull                                                        +---------+---------------+---------+-----------+----------+--------------+ PFV      Full                                                        +---------+---------------+---------+-----------+----------+--------------+ POP      Full           Yes      Yes                                 +---------+---------------+---------+-----------+----------+--------------+ PTV      Full                                                        +---------+---------------+---------+-----------+----------+--------------+ PERO     Full                                                        +---------+---------------+---------+-----------+----------+--------------+   +----+---------------+---------+-----------+----------+--------------+ LEFTCompressibilityPhasicitySpontaneityPropertiesThrombus Aging +----+---------------+---------+-----------+----------+--------------+ CFV Full           Yes      Yes                                  +----+---------------+---------+-----------+----------+--------------+     Summary: RIGHT: - There is no evidence of deep vein thrombosis in the lower extremity.  - No cystic structure found in the popliteal fossa.  LEFT: - No evidence of deep vein thrombosis in the lower extremity. No indirect evidence of obstruction proximal to the inguinal ligament.  *See table(s) above for measurements and observations. Electronically signed by Servando Snare MD on 01/20/2020 at 12:42:03 PM.    Final     Procedures Procedures (including critical care time)  Medications Ordered in ED Medications - No data to display  ED Course  I have reviewed the triage vital signs and the nursing notes.  Pertinent labs & imaging results  that were available during my care of the patient were reviewed by me and considered in my medical decision making (see chart for details).    MDM Rules/Calculators/A&P                          Patient presents with lower back pain and concerns of a DVT.  She is alert, does not appear in acute distress, vital signs reassuring.  Will obtain imaging of her back and obtain DVT study  BMP negative for electrolyte abnormalities, no metabolic acidosis, no AKI, no anion gap present.  UA negative for nitrates, leukocytes, shows few red blood cells, rare bacteria DVT study was negative.  Renal study was negative for stones, shows diverticulosis no  Diverticulitis, thoracic and lumbar spine imaging shows disc degenerative disc disease with spinal stenosis.  No other acute findings  I have low suspicion for spinal fracture or spinal cord abnormality as patient denies urinary incontinency, retention, difficulty with bowel movements, denies saddle paresthesias.  Spine was palpated there is no step-off, crepitus or gross deformities felt, patient had full range of motion, neurovascular fully intact in the lower extremities.  Imaging negative for fractures or dislocation.  Low suspicion for  DVT as study was negative.  Low suspicion for kidney stone as CT renal does not show stones.  Low suspicion for UTI or [place] as patient had no CVA tenderness, no urinary symptoms, UA is negative for infection.  low suspicion for septic arthritis as patient denies IV drug use, skin exam was performed no erythematous, edema or warm joints noted.  Suspect patient suffering from degenerative disc disease causing her pain will provide her with prednisone and have her follow-up with neuro spine for further evaluation.  Vital signs have remained stable, no indication for hospital admission.  Patient discussed with attending and they agreed with assessment and plan.  Patient given at home care as well strict return precautions.  Patient verbalized that they understood agreed to said plan.   Final Clinical Impression(s) / ED Diagnoses Final diagnoses:  Tenderness of left lumbar region  Chronic midline back pain, unspecified back location    Rx / DC Orders ED Discharge Orders         Ordered    predniSONE (DELTASONE) 20 MG tablet  Daily        01/20/20 1252           Marcello Fennel, PA-C 01/20/20 1321    Tegeler, Gwenyth Allegra, MD 01/20/20 [phone]

## 2020-01-20 NOTE — Discharge Instructions (Addendum)
You have been seen here for back pain, I start you on steroids please take as prescribed.  You may also take over-the-counter pain medications like ibuprofen and/or Tylenol every 6 as needed.  Please follow dosage and on the back of bottle.  I also recommend applying heat to the area and stretching out the muscles as this will help decrease stiffness and pain.  I have given you information on exercises please follow.  Recommend follow-up with your PCP or spinal doctor for further evaluation  Come back to the emergency department if you develop chest pain, shortness of breath, severe abdominal pain, uncontrolled nausea, vomiting, diarrhea.

## 2020-01-20 NOTE — ED Triage Notes (Signed)
Pt states she has been having rt hip pain going gown posterior rt leg for a few months, worse the last few days. Has history of DVT's and is concerned.

## 2020-01-20 NOTE — Progress Notes (Signed)
Right lower extremity venous study completed.     Please see CV Proc for preliminary results.   Logun Colavito, RVT  

## 2020-05-18 ENCOUNTER — Ambulatory Visit (HOSPITAL_COMMUNITY)
Admission: EM | Admit: 2020-05-18 | Discharge: 2020-05-18 | Disposition: A | Discharge status: Home / Self Care | Payer: BC Managed Care – PPO | Attending: Medical Oncology | Admitting: Medical Oncology

## 2020-05-18 ENCOUNTER — Encounter (HOSPITAL_COMMUNITY): Payer: Self-pay

## 2020-05-18 ENCOUNTER — Ambulatory Visit (HOSPITAL_COMMUNITY): Payer: Self-pay

## 2020-05-18 ENCOUNTER — Other Ambulatory Visit: Payer: Self-pay

## 2020-05-18 DIAGNOSIS — J441 Chronic obstructive pulmonary disease with (acute) exacerbation: Secondary | ICD-10-CM

## 2020-05-18 DIAGNOSIS — R6889 Other general symptoms and signs: Secondary | ICD-10-CM | POA: Diagnosis not present

## 2020-05-18 HISTORY — DX: Essential (primary) hypertension: I10

## 2020-05-18 MED ORDER — BENZONATATE 200 MG PO CAPS: 200.0000 mg | ORAL_CAPSULE | Freq: Three times a day (TID) | ORAL | 0 refills | Status: DC | PRN

## 2020-05-18 MED ORDER — ALBUTEROL SULFATE HFA 108 (90 BASE) MCG/ACT IN AERS: 1.0000 | INHALATION_SPRAY | Freq: Four times a day (QID) | RESPIRATORY_TRACT | 0 refills | Status: DC | PRN

## 2020-05-18 MED ORDER — PREDNISONE 10 MG (21) PO TBPK: ORAL_TABLET | Freq: Every day | ORAL | 0 refills | Status: DC

## 2020-05-18 MED ORDER — FLUTICASONE PROPIONATE 50 MCG/ACT NA SUSP: 2.0000 | Freq: Every day | NASAL | 0 refills | Status: DC

## 2020-05-18 NOTE — ED Triage Notes (Signed)
Pt presents with congestion, chills, fatigue, sore throat,  and non productive cough X 1 week.   Pt has had 2 negative covid test this week.

## 2020-05-18 NOTE — ED Provider Notes (Signed)
[facility]    CSN: [id number] Arrival date & time: 05/18/20  8127      History   Chief Complaint Chief Complaint  Patient presents with  . Influenza    HPI TIMBERLYNN KIZZIAH is a 60 y.o. female.   HPI  Flu like symptoms: Patient reports that for the past week she has had nasal congestion, chills, fatigue, sore throat and a nonproductive cough. She does have mild and very well controlled COPD. She reports that she does not normally require steroid treatment for flares.  She has had 2 negative COVID test this week so she believes that she has had the flu. Symptoms have been improving as her fever has resolved and she is having more energy. She has been using Nyquil and cough drops for symptoms without significant improvement. No recent fever, chest pain, productive cough or hemoptysis.   Past Medical History:  Diagnosis Date  . Deep vein thrombophlebitis of leg (HCC)   . H/O: Bell's palsy   . Hypertension     There are no problems to display for this patient.   Past Surgical History:  Procedure Laterality Date  . BLADDER SUSPENSION    . CARPAL TUNNEL RELEASE Bilateral   . neck fusion     x4    OB History    Gravida  4   Para  4   Term      Preterm      AB      Living  4     SAB      IAB      Ectopic      Multiple      Live Births               Home Medications    Prior to Admission medications   Not on File    Family History Family History  Problem Relation Age of Onset  . Diabetes Mother   . Stroke Sister   . Cancer Paternal Aunt        stomach  . Cancer Maternal Grandmother        stomach  . Cancer Paternal Grandmother        stomach     Social History Social History   Tobacco Use  . Smoking status: Current Every Day Smoker    Packs/day: 0.50  . Smokeless tobacco: Never Used  Vaping Use  . Vaping Use: Never used  Substance Use Topics  . Alcohol use: Yes    Comment: glass of wine with dinner  . Drug use: Never      Allergies   Patient has no known allergies.   Review of Systems Review of Systems  As stated above in HPI Physical Exam Triage Vital Signs ED Triage Vitals  Enc Vitals Group     BP 05/18/20 1029 135/79     Pulse Rate 05/18/20 1029 93     Resp 05/18/20 1029 17     Temp 05/18/20 1029 98.9 F (37.2 C)     Temp Source 05/18/20 1029 Oral     SpO2 05/18/20 1029 98 %     Weight --      Height --      Head Circumference --      Peak Flow --      Pain Score 05/18/20 1026 6     Pain Loc --      Pain Edu? --      Excl. in [place]? --  No data found.  Updated Vital Signs BP 135/79 (BP Location: Right Arm)   Pulse 93   Temp 98.9 F (37.2 C) (Oral)   Resp 17   SpO2 98%   Physical Exam Vitals and nursing note reviewed.  Constitutional:      General: She is not in acute distress.    Appearance: Normal appearance. She is not ill-appearing, toxic-appearing or diaphoretic.  HENT:     Head: Normocephalic and atraumatic.     Right Ear: Tympanic membrane, ear canal and external ear normal.     Left Ear: Tympanic membrane, ear canal and external ear normal.     Nose: Congestion and rhinorrhea present.     Mouth/Throat:     Mouth: Mucous membranes are moist.     Pharynx: Oropharynx is clear. No oropharyngeal exudate or posterior oropharyngeal erythema.  Eyes:     Extraocular Movements: Extraocular movements intact.     Pupils: Pupils are equal, round, and reactive to light.  Cardiovascular:     Rate and Rhythm: Normal rate and regular rhythm.     Heart sounds: Normal heart sounds.  Pulmonary:     Effort: Pulmonary effort is normal.     Breath sounds: Normal breath sounds.  Musculoskeletal:     Cervical back: Normal range of motion and neck supple.     Right lower leg: No edema.     Left lower leg: No edema.  Lymphadenopathy:     Cervical: No cervical adenopathy.  Skin:    General: Skin is warm.     Coloration: Skin is not pale.     Findings: No erythema or rash.   Neurological:     Mental Status: She is alert and oriented to person, place, and time.      UC Treatments / Results  Labs (all labs ordered are listed, but only abnormal results are displayed) Labs Reviewed - No data to display  EKG   Radiology No results found.  Procedures Procedures (including critical care time)  Medications Ordered in UC Medications - No data to display  Initial Impression / Assessment and Plan / UC Course  I have reviewed the triage vital signs and the nursing notes.  Pertinent labs & imaging results that were available during my care of the patient were reviewed by me and considered in my medical decision making (see chart for details).     New. COPD flare likely secondary to influenza like illness. Discussed. Will send in albuterol, tessalon and flonase.  We discussed that if symptoms do not improve with these efforts and I do want her to start the prednisone tablets.  Discussed how to take along with, potential side effects and precautions.  Also discussed red flag signs and symptoms. Final Clinical Impressions(s) / UC Diagnoses   Final diagnoses:  None   Discharge Instructions   None    ED Prescriptions    None     PDMP not reviewed this encounter.   Hughie Closs, [place] 05/18/20 1125

## 2020-06-13 ENCOUNTER — Encounter (HOSPITAL_COMMUNITY): Payer: Self-pay

## 2021-01-27 DIAGNOSIS — C3490 Malignant neoplasm of unspecified part of unspecified bronchus or lung: Secondary | ICD-10-CM

## 2021-01-27 HISTORY — DX: Malignant neoplasm of unspecified part of unspecified bronchus or lung: C34.90

## 2021-02-23 IMAGING — CT CT T SPINE W/O CM
3 series · 11 of 33 positions shown, 12 images · non-contrast
Comparison: None.

CLINICAL DATA: Mid back pain.  Leg weakness.

EXAM:
CT THORACIC SPINE WITHOUT CONTRAST
TECHNIQUE: Multidetector CT images of the thoracic were obtained using the
standard protocol without intravenous contrast.

[Series 4: t spine st · axial · 0.38mm/px · z∈[+1497,+1679]mm · 3 of 149 slices shown, 4 images]
[im 35/149  soft-tissue]
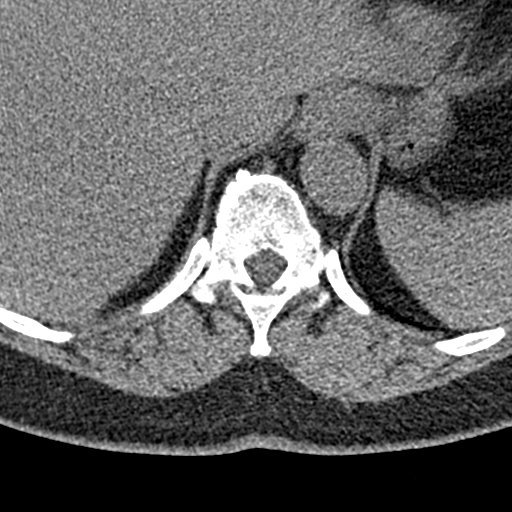
[im 35/149  bone]
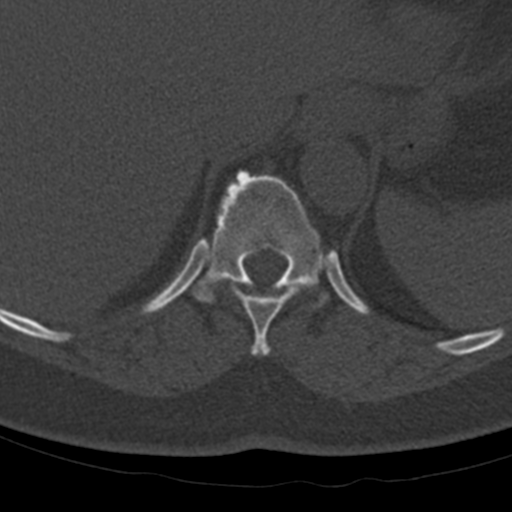
[im 80/149  bone]
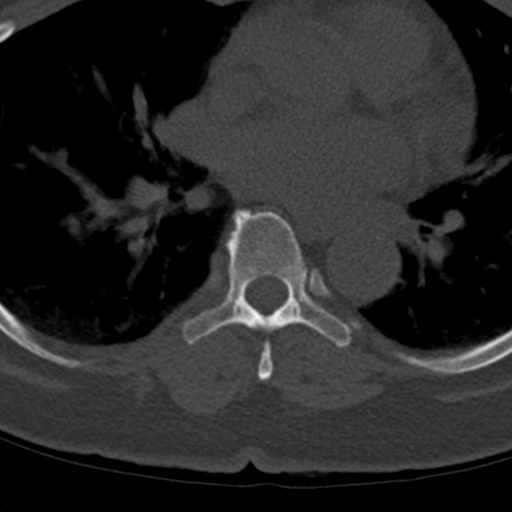
[im 126/149  bone]
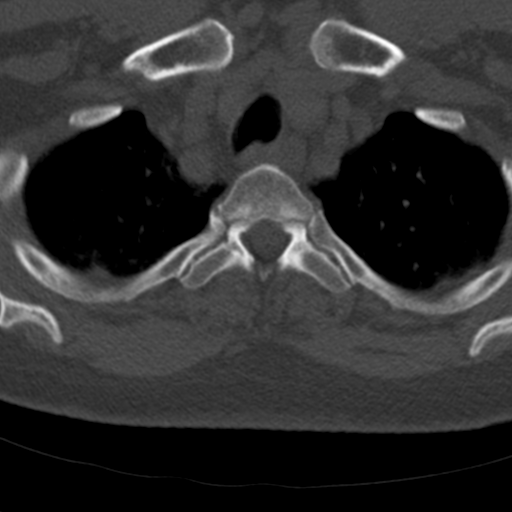

[Series 8: coronal bone · coronal · 0.33mm/px · 3 of 61 slices shown]
[im 13/61  bone]
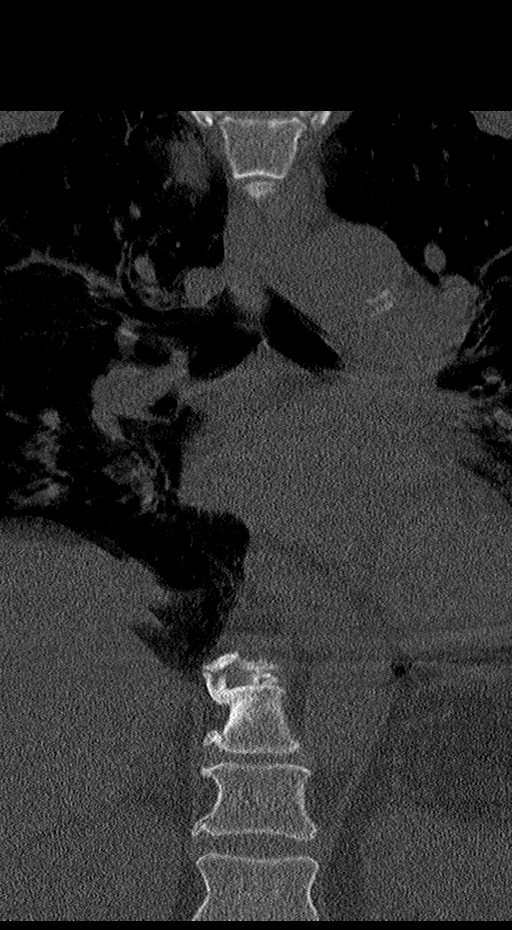
[im 25/61  bone]
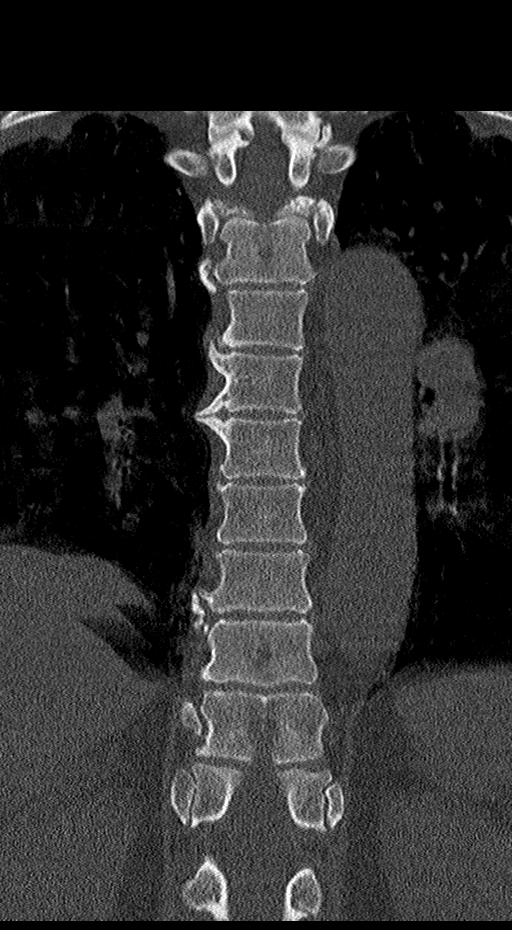
[im 37/61  bone]
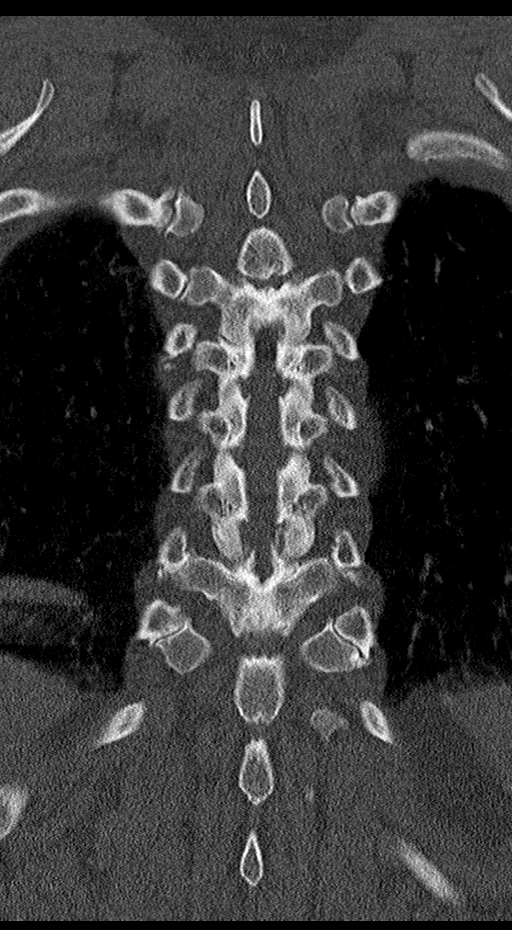

[Series 9: sagittal bone · sagittal · 0.26mm/px · 5 of 61 slices shown]
[im 21/61  bone]
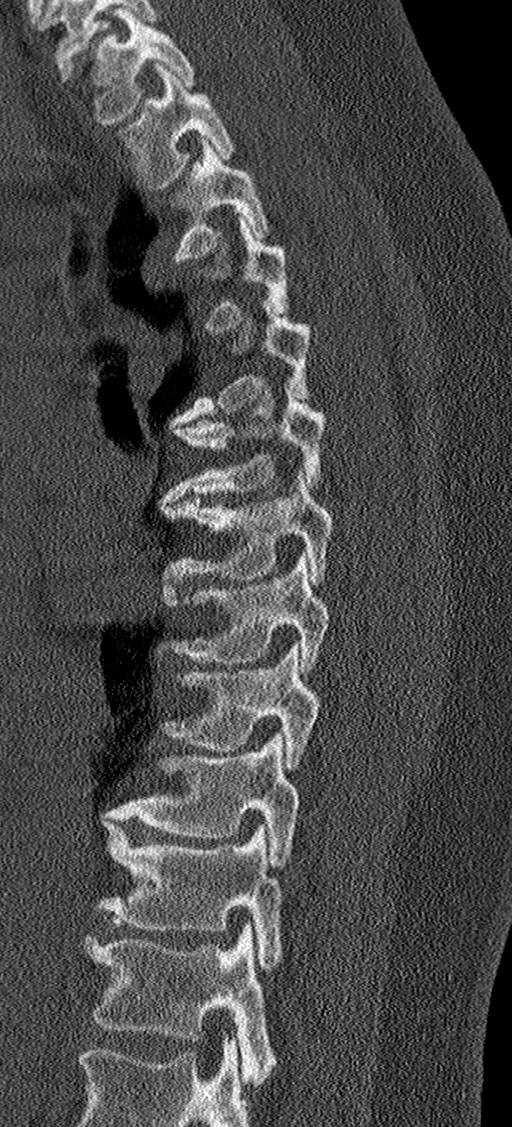
[im 26/61  bone]
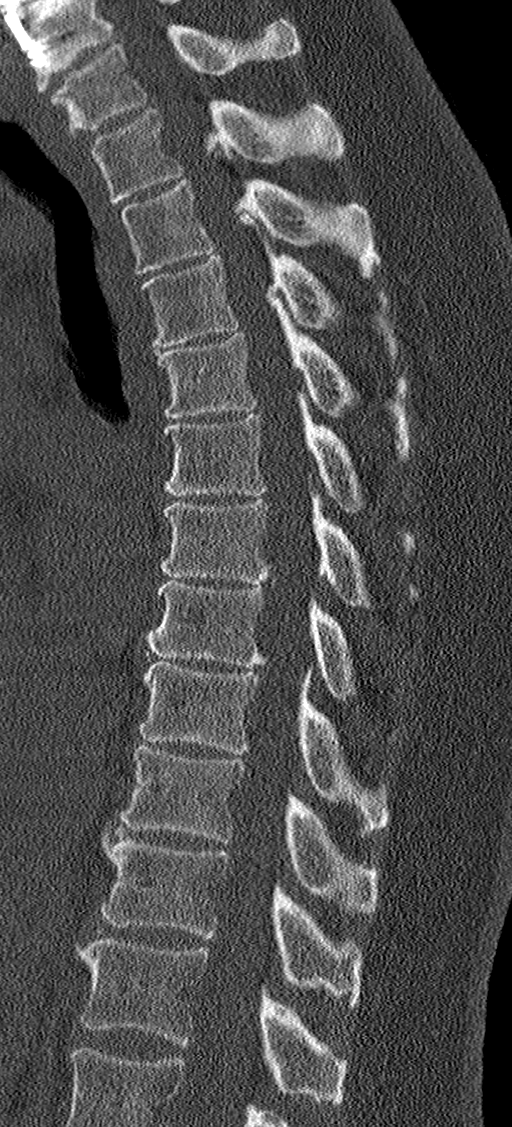
[im 31/61  bone]
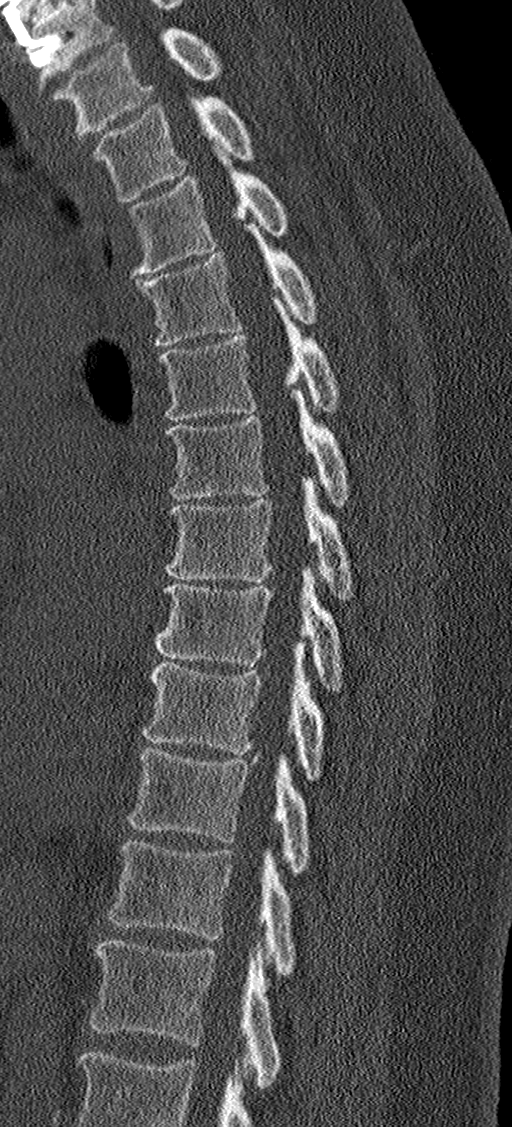
[im 36/61  bone]
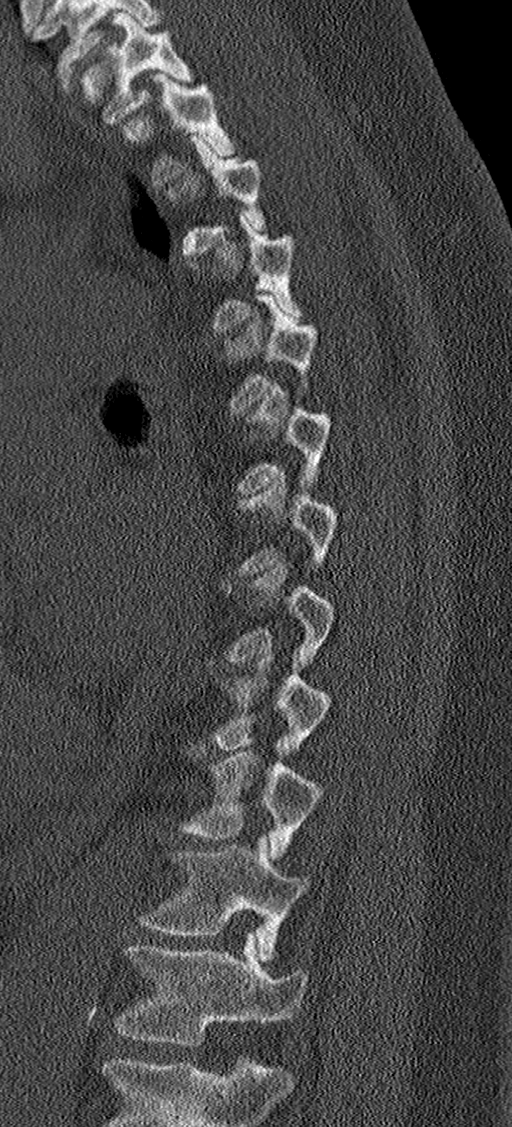
[im 41/61  bone]
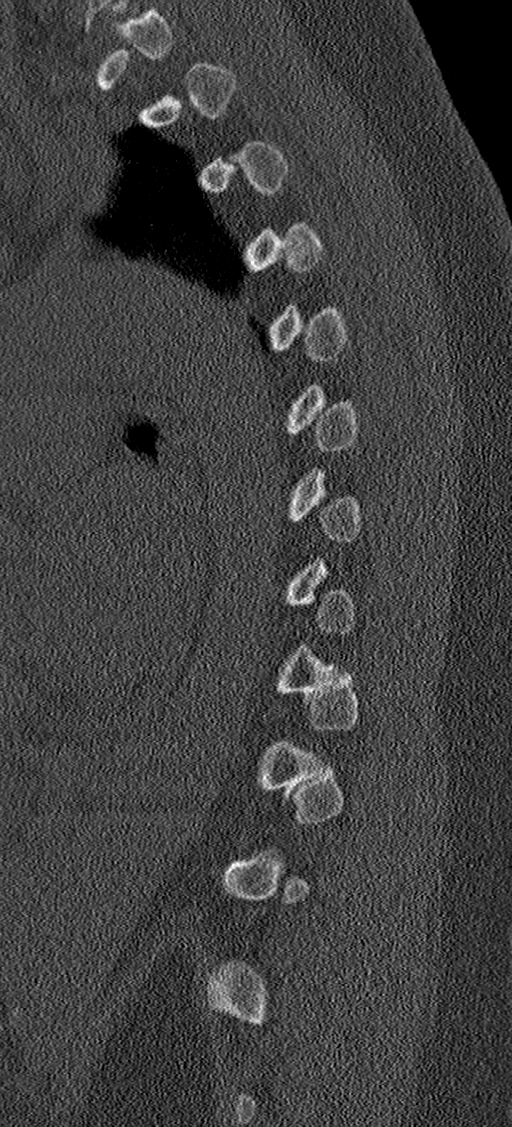

[11 of 33 positions shown; findings below may reference images not displayed]

FINDINGS: Alignment: Normal

Vertebrae: Negative for vertebral fracture or mass.

Paraspinal and other soft tissues: No paraspinous mass or
adenopathy. Visualized lungs clear.

Disc levels: ACDF with plate and screws at C6-7. Anatomy above C7
not imaged.

Disc degeneration with endplate spurring is present at multiple
levels extending from T5 through T10. No significant spinal or
foraminal stenosis at these levels. There is left foraminal
narrowing at T3-4 due to endplate spurring and facet hypertrophy.
Mild facet degeneration is present in the thoracic spine.
IMPRESSION: No acute abnormality.  Negative for fracture or mass.

Multilevel disc degeneration and mild spurring throughout the
thoracic spine. No significant spinal canal stenosis. Left foraminal
narrowing T3-4.

## 2021-03-28 DIAGNOSIS — E559 Vitamin D deficiency, unspecified: Secondary | ICD-10-CM | POA: Insufficient documentation

## 2021-09-03 ENCOUNTER — Other Ambulatory Visit: Payer: Self-pay

## 2021-09-03 ENCOUNTER — Emergency Department (HOSPITAL_BASED_OUTPATIENT_CLINIC_OR_DEPARTMENT_OTHER)
Admission: EM | Admit: 2021-09-03 | Discharge: 2021-09-03 | Disposition: A | Discharge status: Home / Self Care | Payer: Self-pay | Attending: Emergency Medicine | Admitting: Emergency Medicine

## 2021-09-03 ENCOUNTER — Emergency Department (HOSPITAL_BASED_OUTPATIENT_CLINIC_OR_DEPARTMENT_OTHER): Payer: Self-pay

## 2021-09-03 ENCOUNTER — Encounter (HOSPITAL_BASED_OUTPATIENT_CLINIC_OR_DEPARTMENT_OTHER): Payer: Self-pay

## 2021-09-03 DIAGNOSIS — M545 Low back pain, unspecified: Secondary | ICD-10-CM | POA: Insufficient documentation

## 2021-09-03 DIAGNOSIS — R911 Solitary pulmonary nodule: Secondary | ICD-10-CM | POA: Insufficient documentation

## 2021-09-03 DIAGNOSIS — M79605 Pain in left leg: Secondary | ICD-10-CM | POA: Insufficient documentation

## 2021-09-03 DIAGNOSIS — F17200 Nicotine dependence, unspecified, uncomplicated: Secondary | ICD-10-CM | POA: Insufficient documentation

## 2021-09-03 DIAGNOSIS — J449 Chronic obstructive pulmonary disease, unspecified: Secondary | ICD-10-CM | POA: Insufficient documentation

## 2021-09-03 DIAGNOSIS — Z7951 Long term (current) use of inhaled steroids: Secondary | ICD-10-CM | POA: Insufficient documentation

## 2021-09-03 DIAGNOSIS — I10 Essential (primary) hypertension: Secondary | ICD-10-CM | POA: Insufficient documentation

## 2021-09-03 DIAGNOSIS — Z20822 Contact with and (suspected) exposure to covid-19: Secondary | ICD-10-CM | POA: Insufficient documentation

## 2021-09-03 LAB — CBC WITH DIFFERENTIAL/PLATELET
Abs Immature Granulocytes: 0.03 10*3/uL (ref 0.00–0.07)
Basophils Absolute: 0.1 10*3/uL (ref 0.0–0.1)
Basophils Relative: 1 %
Eosinophils Absolute: 0.2 10*3/uL (ref 0.0–0.5)
Eosinophils Relative: 1 %
HCT: 45.1 % (ref 36.0–46.0)
Hemoglobin: 15.2 g/dL — ABNORMAL HIGH (ref 12.0–15.0)
Immature Granulocytes: 0 %
Lymphocytes Relative: 36 %
Lymphs Abs: 3.9 10*3/uL (ref 0.7–4.0)
MCH: 28.2 pg (ref 26.0–34.0)
MCHC: 33.7 g/dL (ref 30.0–36.0)
MCV: 83.7 fL (ref 80.0–100.0)
Monocytes Absolute: 0.8 10*3/uL (ref 0.1–1.0)
Monocytes Relative: 7 %
Neutro Abs: 6 10*3/uL (ref 1.7–7.7)
Neutrophils Relative %: 55 %
Platelets: 213 10*3/uL (ref 150–400)
RBC: 5.39 MIL/uL — ABNORMAL HIGH (ref 3.87–5.11)
RDW: 15.6 % — ABNORMAL HIGH (ref 11.5–15.5)
WBC: 10.9 10*3/uL — ABNORMAL HIGH (ref 4.0–10.5)
nRBC: 0 % (ref 0.0–0.2)

## 2021-09-03 LAB — MAGNESIUM: Magnesium: 1.8 mg/dL (ref 1.7–2.4)

## 2021-09-03 LAB — BASIC METABOLIC PANEL
Anion gap: 13 (ref 5–15)
BUN: 14 mg/dL (ref 8–23)
CO2: 25 mmol/L (ref 22–32)
Calcium: 10 mg/dL (ref 8.9–10.3)
Chloride: 102 mmol/L (ref 98–111)
Creatinine, Ser: 0.74 mg/dL (ref 0.44–1.00)
GFR, Estimated: >60 mL/min (ref >60)
Glucose, Bld: 85 mg/dL (ref 70–99)
Potassium: 4 mmol/L (ref 3.5–5.1)
Sodium: 140 mmol/L (ref 135–145)

## 2021-09-03 LAB — SARS CORONAVIRUS 2 BY RT PCR: SARS Coronavirus 2 by RT PCR: NEGATIVE

## 2021-09-03 LAB — TROPONIN I (HIGH SENSITIVITY): Troponin I (High Sensitivity): 5 ng/L (ref <18)

## 2021-09-03 MED ORDER — IOHEXOL 350 MG/ML SOLN
75.0000 mL | Freq: Once | INTRAVENOUS | Status: AC | PRN
  Administered 2021-09-03: 75 mL via INTRAVENOUS

## 2021-09-03 MED ORDER — IPRATROPIUM-ALBUTEROL 0.5-2.5 (3) MG/3ML IN SOLN
3.0000 mL | Freq: Once | RESPIRATORY_TRACT | Status: AC
  Administered 2021-09-03: 3 mL via RESPIRATORY_TRACT
  Filled 2021-09-03: qty 3

## 2021-09-03 NOTE — ED Triage Notes (Signed)
Patient here POV from Home.  Endorses Left Leg Swelling and Pain for a few Days. Seen by PCP today and referred for Imaging.   No Known Fevers. No Anticoagulants. History of COPD and DVT 7-8 Years ago.   NAD Noted during Triage. A&Ox4. GCS 15. Ambulatory.

## 2021-09-03 NOTE — ED Notes (Signed)
Discharge paperwork given and verbally understood. 

## 2021-09-03 NOTE — ED Provider Notes (Signed)
[facility] EMERGENCY DEPT Provider Note   CSN: [id number] Arrival date & time: 09/03/21  1331     History  Chief Complaint  Patient presents with   Leg Swelling    Anna Roth is a 61 y.o. female presents to the ED for leg pain of 2 days duration.  The pain began suddenly.  It starts behind the knee and radiates to the foot.  It is improved with sitting.  Described as a dull pain, until she tries to walk on it and then it becomes sharper.  It is constant.  It is a 5 out of 10 right now but at its worst it is a 7 out of 10.  Was evaluated by PCP who referred her for imaging.  Has not had any long trips in car or plane.  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  She takes some ibuprofen for the pain without much effect.  View of systems positive for mild shortness of breath and chest pain which she attributes to COPD and chronic cough.  Negative for fevers, sweats, leg swelling, redness of the skin on the leg.  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  HPI     Home Medications Prior to Admission medications   Medication Sig Start Date End Date Taking? Authorizing Provider  albuterol (VENTOLIN HFA) 108 (90 Base) MCG/ACT inhaler Inhale 1-2 puffs into the lungs every 6 (six) hours as needed for wheezing or shortness of breath. 05/18/20   Hughie Closs, PA-C  benzonatate (TESSALON) 200 MG capsule Take 1 capsule (200 mg total) by mouth 3 (three) times daily as needed for cough. 05/18/20   Hughie Closs, PA-C  fluticasone (FLONASE) 50 MCG/ACT nasal spray Place 2 sprays into both nostrils daily. 05/18/20   Hughie Closs, PA-C  predniSONE (STERAPRED UNI-PAK 21 TAB) 10 MG (21) TBPK tablet Take by mouth daily. Take 6 tabs by mouth daily  for 2 days, then 5 tabs for 2 days, then 4 tabs for 2 days, then 3 tabs for 2 days, 2  tabs for 2 days, then 1 tab by mouth daily for 2 days 05/18/20   Hughie Closs, PA-C      Allergies    Patient has no known allergies.    Review of Systems   Review of Systems  All other systems reviewed and are negative.   Physical Exam Updated Vital Signs BP (!) 165/73 (BP Location: Right Arm)   Pulse 88   Temp 98.5 F (36.9 C) (Oral)   Resp (!) 24   Ht 5\' 7"  (1.702 m)   Wt 82.6 kg   SpO2 94%   BMI 28.52 kg/m  Physical Exam Vitals and nursing note reviewed.  Constitutional:      General: She is not in acute distress.    Appearance: She is well-developed.  HENT:     Head: Normocephalic and atraumatic.  Eyes:     Conjunctiva/sclera: Conjunctivae normal.  Cardiovascular:     Rate and Rhythm: Normal rate and regular rhythm.     Heart sounds: No murmur heard. Pulmonary:     Effort: No respiratory distress.     Breath sounds: Wheezing (Left lung wheezing) present.  Abdominal:     Palpations: Abdomen is soft.     Tenderness: There is no abdominal tenderness.  Musculoskeletal:  General: No swelling or tenderness.     Cervical back: Neck supple.     Right lower leg: No edema.     Left lower leg: No edema.  Skin:    General: Skin is warm and dry.     Capillary Refill: Capillary refill takes less than 2 seconds.  Neurological:     Mental Status: She is alert.  Psychiatric:        Mood and Affect: Mood normal.     ED Results / Procedures / Treatments   Labs (all labs ordered are listed, but only abnormal results are displayed) Labs Reviewed  CBC WITH DIFFERENTIAL/PLATELET - Abnormal; Notable for the following components:      Result Value   WBC 10.9 (*)    RBC 5.39 (*)    Hemoglobin 15.2 (*)    RDW 15.6 (*)    All other components within normal limits  SARS CORONAVIRUS 2 BY RT PCR  BASIC METABOLIC PANEL  MAGNESIUM  TROPONIN I (HIGH SENSITIVITY)    EKG EKG Interpretation  Date/Time:  Tuesday September 03 2021 35:36:14 EDT Ventricular Rate:   82 PR Interval:  125 QRS Duration: 88 QT Interval:  376 QTC Calculation: 440 R Axis:   59 Text Interpretation: Sinus rhythm Probable left atrial enlargement Anteroseptal infarct, old Minimal ST depression, inferior leads Confirmed by Regan Lemming ([id number]) on 09/03/2021 5:02:37 PM  Radiology CT Angio Chest PE W and/or Wo Contrast  Result Date: 09/03/2021 CLINICAL DATA:  Left leg pain and swelling a few days. Possible pulmonary embolism. EXAM: CT ANGIOGRAPHY CHEST WITH CONTRAST TECHNIQUE: Multidetector CT imaging of the chest was performed using the standard protocol during bolus administration of intravenous contrast. Multiplanar CT image reconstructions and MIPs were obtained to evaluate the vascular anatomy. RADIATION DOSE REDUCTION: This exam was performed according to the departmental dose-optimization program which includes automated exposure control, adjustment of the mA and/or kV according to patient size and/or use of iterative reconstruction technique. CONTRAST:  6mL OMNIPAQUE IOHEXOL 350 MG/ML SOLN COMPARISON:  None Available. FINDINGS: Cardiovascular: Heart is normal size. Thoracic aorta is normal in caliber. Pulmonary arterial system is well opacified without evidence of emboli. Remaining vascular structures are unremarkable. Mediastinum/Nodes: No evidence of mediastinal or hilar adenopathy. Remaining mediastinal structures are unremarkable. Lungs/Pleura: Lungs are adequately inflated. Minimal bibasilar atelectasis. No effusion. Irregular nodule over the anterolateral right upper lobe measuring 1.4 cm (1.1 x 1.7 cm). Airways are normal. Upper Abdomen: No acute findings. Minimal calcified plaque over the abdominal aorta. Musculoskeletal: No focal abnormality. Review of the MIP images confirms the above findings. IMPRESSION: 1. No acute cardiopulmonary disease and no evidence of pulmonary embolism. 2. Irregular 1.4 cm nodule over the right upper lobe. 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 Recommend referral to [facility]). Per Fleischner Society Guidelines, consider a non-contrast Chest CT at 3 months, a PET/CT, or tissue sampling. These guidelines do not apply to immunocompromised patients and patients with cancer. Follow up in patients with significant comorbidities as clinically warranted. For lung cancer screening, adhere to Lung-RADS guidelines. Reference: Radiology. 2017; 284(1):228-43. 3. Aortic atherosclerosis. Aortic Atherosclerosis (ICD10-I70.0). Electronically Signed   By: Marin Olp M.D.   On: 09/03/2021 18:31   US Venous Img Lower Unilateral Left  Result Date: 09/03/2021 CLINICAL DATA:  Pain and swelling EXAM: Left LOWER EXTREMITY VENOUS DOPPLER ULTRASOUND TECHNIQUE: Gray-scale sonography with compression, as well as color and duplex ultrasound, were performed to evaluate the deep venous system(s) from the level  of the common femoral vein through the popliteal and proximal calf veins. COMPARISON:  None Available. FINDINGS: VENOUS Normal compressibility of the common femoral, superficial femoral, and popliteal veins, as well as the visualized calf veins. Visualized portions of profunda femoral vein and great saphenous vein unremarkable. No filling defects to suggest DVT on grayscale or color Doppler imaging. Doppler waveforms show normal direction of venous flow, normal respiratory plasticity and response to augmentation. Limited views of the contralateral common femoral vein are unremarkable. OTHER None. Limitations: none IMPRESSION: There is no evidence of deep venous thrombosis in left lower extremity. Electronically Signed   By: Elmer Picker M.D.   On: 09/03/2021 16:08    Medications Ordered in ED Medications  ipratropium-albuterol (DUONEB) 0.5-2.5 (3) MG/3ML nebulizer solution 3 mL (3 mLs Nebulization Given 09/03/21 1648)  iohexol (OMNIPAQUE) 350 MG/ML injection 75 mL (75 mLs Intravenous Contrast Given  09/03/21 1809)    ED Course/ Medical Decision Making/ A&P Clinical Course as of 09/03/21 1917  Tue Sep 03, 2021  1843 CT Angio Chest PE W and/or Wo Contrast CT negative for PE but does show concerning 1.4 cm nodule RU lobe. [MM]    Clinical Course User Index [MM] Nani Gasser, MD                           Medical Decision Making Amount and/or Complexity of Data Reviewed Labs: ordered. Radiology: ordered. Decision-making details documented in ED Course.  Risk Prescription drug management.   61 year old female with a history of provoked VTE, COPD, and low back pain who presents with 2 days of left leg pain.  Also complains of cough, shortness of breath, chest pain.  Index of suspicion for PE is high.  Differential includes mild COPD exacerbation, lower extremity nerve impingement, peripheral vascular disease, tendinopathy.   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 External records from outside source obtained and reviewed including prior ED visits, visits to PCP.   Lab Tests:  I Ordered (or co-signed), and personally interpreted labs.  The pertinent results include: BMP CBC with differential Magnesium Troponin   Imaging Studies ordered:  I ordered (or co-signed) imaging studies including left lower extremity venous ultrasound, CT PE protocol I independently visualized and interpreted imaging which showed no evidence of PE but 1.4 cm nodule in the right upper lobe I agree with the radiologist interpretation  Medicines ordered and prescription drug management:  I ordered medication including DuoNeb Reevaluation of the patient after these medicines showed that the patient improved  Reevaluation:  After the interventions  noted above, I reevaluated the patient and found that they have :improved.    Dispostion:  After consideration of the diagnostic results and the patients response to treatment, I feel that the patent would benefit from discharge to home and follow-up with pulmonology for further evaluation of concerning right lung nodule.          Final Clinical Impression(s) / ED Diagnoses Final diagnoses:  Lung nodule  Smoker    Rx / DC Orders ED Discharge Orders          Ordered    AMB  Referral to Pulmonary Nodule Clinic        09/03/21 1847  Nani Gasser, MD 09/03/21 8177    Regan Lemming, MD 09/03/21 [phone]

## 2021-09-04 ENCOUNTER — Telehealth: Payer: Self-pay | Admitting: Pulmonary Disease

## 2021-09-04 NOTE — Telephone Encounter (Signed)
Patient is scheduled tomorrow 09/05/2021 at 2pm with Dr. Valeta Harms. Nothing further needed.

## 2021-09-04 NOTE — Telephone Encounter (Signed)
Message sent to me by Dr. Valeta Harms to see if we could get pt in for an appt either this week or next week on the 17th for a consultation for lung nodule.  Attempted to call pt but unable to reach. Left message for her to return call to get an appt scheduled. Routing to front desk pool for help.

## 2021-09-05 ENCOUNTER — Encounter: Payer: Self-pay | Admitting: Pulmonary Disease

## 2021-09-05 ENCOUNTER — Ambulatory Visit (INDEPENDENT_AMBULATORY_CARE_PROVIDER_SITE_OTHER): Payer: 59 | Admitting: Pulmonary Disease

## 2021-09-05 ENCOUNTER — Telehealth: Payer: Self-pay

## 2021-09-05 VITALS — BP 160/100 | HR 92 | Ht 67.0 in | Wt 194.8 lb

## 2021-09-05 DIAGNOSIS — R918 Other nonspecific abnormal finding of lung field: Secondary | ICD-10-CM | POA: Diagnosis not present

## 2021-09-05 DIAGNOSIS — R599 Enlarged lymph nodes, unspecified: Secondary | ICD-10-CM | POA: Diagnosis not present

## 2021-09-05 NOTE — Telephone Encounter (Signed)
Collene Gobble, MD  Dierdre Highman, RN Need to make she she sees RB or BI in a nodule slot. Thanks.  RB        Previous Messages    ----- Message -----  From: Julian Hy, DO  Sent: 09/03/2021   6:50 PM EDT  To: Collene Gobble, MD; Garner Nash, DO  Subject: pulm nodule 1.4cm in RUL, smoker               Leroy Sea and Rob,   The ED contacted me about a patient that needs pulm follow up. They put in the referral for pulm nodule clinic but I wanted you to be aware just in case. Thanks!   Paige   ----- Message -----  From: Regan Lemming, MD  Sent: 09/03/2021   6:48 PM EDT  To: Julian Hy, DO   Pulm nodule found on CT, active smoker   Pt scheduled at 2pm today 09/05/21

## 2021-09-05 NOTE — Patient Instructions (Signed)
Thank you for visiting Dr. Valeta Harms at [facility]. Today we recommend the following:  Orders Placed This Encounter  Procedures   Procedural/ Surgical Case Request: ROBOTIC ASSISTED NAVIGATIONAL BRONCHOSCOPY, VIDEO BRONCHOSCOPY WITH ENDOBRONCHIAL ULTRASOUND   Ambulatory referral to Pulmonology   Bronchoscopy on 09/10/2021  Return in about 12 days (around 09/17/2021) for w/ Eric Form, NP .    Please do your part to reduce the spread of COVID-19.

## 2021-09-05 NOTE — H&P (View-Only) (Signed)
Synopsis: Referred in August 2023 for lung nodule by [facility]  Subjective:   PATIENT ID: Anna Roth: female DOB: Oct 01, 1960, MRN: [id number]  Chief Complaint  Patient presents with   Consult    Consult: Lung nodule    This is a 61 year old female recently went to the emergency room with leg pain.  Had a history of blood clot several years ago and thrombophlebitis of the leg.  She had of having a CTA of the chest for evaluation of PE which was negative.  However this revealed a right upper lobe pulmonary nodule that was concerning for malignancy.  Patient was found to have a 17 mm lesion.  There is also a small paratracheal node on CT imaging.  Patient was referred here for consideration of bronchoscopy and biopsy.  Patient is agreeable to proceed.  We talked about the risk benefits and alternatives today in the office.  She is still smoking.  She has smoked since she was a teenager.  We talked about smoke cessation counseling.  She has used Chantix in the past but did not like it because it caused vivid dreams.    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[place])    H/O: Bell's palsy    Hypertension      Family History  Problem Relation Age of Onset   Diabetes Mother    Stroke Sister    Cancer Paternal Aunt        stomach   Cancer Maternal Grandmother        stomach   Cancer Paternal Grandmother        stomach      Past Surgical History:  Procedure Laterality Date   BLADDER SUSPENSION     CARPAL TUNNEL RELEASE Bilateral    neck fusion     x4    Social History   Socioeconomic History   Marital status: Married    Spouse name: Not on file   Number of children: Not on file   Years of education: Not on file   Highest education level: Not on file  Occupational History   Not on file  Tobacco Use   Smoking status: Every Day    Packs/day: 0.50    Types: Cigarettes   Smokeless tobacco: Never   Tobacco comments:     Smokes 4 packs of cigarettes a week. 09/05/2021 Tay  Vaping Use   Vaping Use: Never used  Substance and Sexual Activity   Alcohol use: Yes    Comment: glass of wine with dinner   Drug use: Never   Sexual activity: Never  Other Topics Concern   Not on file  Social History Narrative   ** Merged History Encounter **       Social Determinants of Health   Financial Resource Strain: Not on file  Food Insecurity: Not on file  Transportation Needs: Not on file  Physical Activity: Not on file  Stress: Not on file  Social Connections: Not on file  Intimate Partner Violence: Not on file     No Known Allergies   Outpatient Medications Prior to Visit  Medication Sig Dispense Refill   Dupilumab (DUPIXENT) 300 MG/2ML SOPN Inject 300 mg into the skin.     albuterol (VENTOLIN HFA) 108 (90 Base) MCG/ACT inhaler Inhale 1-2 puffs into the lungs every 6 (six) hours as needed for wheezing or shortness of breath. (Patient not taking: Reported on 09/05/2021) 18 g 0  benzonatate (TESSALON) 200 MG capsule Take 1 capsule (200 mg total) by mouth 3 (three) times daily as needed for cough. (Patient not taking: Reported on 09/05/2021) 20 capsule 0   fluticasone (FLONASE) 50 MCG/ACT nasal spray Place 2 sprays into both nostrils daily. (Patient not taking: Reported on 09/05/2021) 16 mL 0   predniSONE (STERAPRED UNI-PAK 21 TAB) 10 MG (21) TBPK tablet Take by mouth daily. Take 6 tabs by mouth daily  for 2 days, then 5 tabs for 2 days, then 4 tabs for 2 days, then 3 tabs for 2 days, 2 tabs for 2 days, then 1 tab by mouth daily for 2 days (Patient not taking: Reported on 09/05/2021) 42 tablet 0   No facility-administered medications prior to visit.    Review of Systems  Constitutional:  Negative for chills, fever, malaise/fatigue and weight loss.  HENT:  Negative for hearing loss, sore throat and tinnitus.   Eyes:  Negative for blurred vision and double vision.  Respiratory:  Negative for cough, hemoptysis, sputum  production, shortness of breath, wheezing and stridor.   Cardiovascular:  Negative for chest pain, palpitations, orthopnea, leg swelling and PND.  Gastrointestinal:  Negative for abdominal pain, constipation, diarrhea, heartburn, nausea and vomiting.  Genitourinary:  Negative for dysuria, hematuria and urgency.  Musculoskeletal:  Negative for joint pain and myalgias.  Skin:  Negative for itching and rash.  Neurological:  Negative for dizziness, tingling, weakness and headaches.  Endo/Heme/Allergies:  Negative for environmental allergies. Does not bruise/bleed easily.  Psychiatric/Behavioral:  Negative for depression. The patient is not nervous/anxious and does not have insomnia.   All other systems reviewed and are negative.    Objective:  Physical Exam Vitals reviewed.  Constitutional:      General: She is not in acute distress.    Appearance: She is well-developed.  HENT:     Head: Normocephalic and atraumatic.  Eyes:     General: No scleral icterus.    Conjunctiva/sclera: Conjunctivae normal.     Pupils: Pupils are equal, round, and reactive to light.  Neck:     Vascular: No JVD.     Trachea: No tracheal deviation.  Cardiovascular:     Rate and Rhythm: Normal rate and regular rhythm.     Heart sounds: Normal heart sounds. No murmur heard. Pulmonary:     Effort: Pulmonary effort is normal. No tachypnea, accessory muscle usage or respiratory distress.     Breath sounds: No stridor. No wheezing, rhonchi or rales.  Abdominal:     General: There is no distension.     Palpations: Abdomen is soft.     Tenderness: There is no abdominal tenderness.  Musculoskeletal:        General: No tenderness.     Cervical back: Neck supple.  Lymphadenopathy:     Cervical: No cervical adenopathy.  Skin:    General: Skin is warm and dry.     Capillary Refill: Capillary refill takes less than 2 seconds.     Findings: No rash.  Neurological:     Mental Status: She is alert and oriented to  person, place, and time.  Psychiatric:        Behavior: Behavior normal.      Vitals:   09/05/21 1400  BP: (!) 160/100  Pulse: 92  SpO2: 93%  Weight: 194 lb 12.8 oz (88.4 kg)  Height: 5\' 7"  (1.702 m)   93% on RA BMI Readings from Last 3 Encounters:  09/05/21 30.51 kg/m  09/03/21 28.52 kg/m  01/20/20 28.51 kg/m   Wt Readings from Last 3 Encounters:  09/05/21 194 lb 12.8 oz (88.4 kg)  09/03/21 182 lb 1.6 oz (82.6 kg)  01/20/20 182 lb (82.6 kg)     CBC    Component Value Date/Time   WBC 10.9 (H) 09/03/2021 1635   RBC 5.39 (H) 09/03/2021 1635   HGB 15.2 (H) 09/03/2021 1635   HGB 15.4 05/27/2016 1026   HCT 45.1 09/03/2021 1635   HCT 46.7 (H) 05/27/2016 1026   PLT 213 09/03/2021 1635   PLT 248 05/27/2016 1026   MCV 83.7 09/03/2021 1635   MCV 82 05/27/2016 1026   MCH 28.2 09/03/2021 1635   MCHC 33.7 09/03/2021 1635   RDW 15.6 (H) 09/03/2021 1635   RDW 14.6 05/27/2016 1026   LYMPHSABS 3.9 09/03/2021 1635   MONOABS 0.8 09/03/2021 1635   EOSABS 0.2 09/03/2021 1635   BASOSABS 0.1 09/03/2021 1635     Chest Imaging: August 2023 CT chest: Recent CT scan of the chest with concern for right upper lobe 1.7 cm pulmonary nodule concerning for primary bronchogenic carcinoma.  Patient also has small paratracheal node. The patient's images have been independently reviewed by me.    Pulmonary Functions Testing Results:     No data to display          FeNO:   Pathology:   Echocardiogram:   Heart Catheterization:     Assessment & Plan:     ICD-10-CM   1. Lung mass  R91.8 Procedural/ Surgical Case Request: ROBOTIC ASSISTED NAVIGATIONAL BRONCHOSCOPY, VIDEO BRONCHOSCOPY WITH ENDOBRONCHIAL ULTRASOUND    Ambulatory referral to Pulmonology    NM PET Image Initial (PI) Skull Base To Thigh (F-18 FDG)    Pulmonary Function Test    2. Adenopathy  R59.9 Procedural/ Surgical Case Request: ROBOTIC ASSISTED NAVIGATIONAL BRONCHOSCOPY, VIDEO BRONCHOSCOPY WITH ENDOBRONCHIAL  ULTRASOUND    Ambulatory referral to Pulmonology    NM PET Image Initial (PI) Skull Base To Thigh (F-18 FDG)    Pulmonary Function Test      Discussion:  This is a 61 year old female found to have a incidental right upper lobe 1.7 cm pulmonary nodule concerning for primary bronchogenic carcinoma.  She is a longstanding history of smoking since a teenager.  She is still smoking daily.  She was counseled today on smoking cessation.  Plan: Today in the office we discussed the risk benefits and alternatives of proceeding with robotic assisted navigation bronchoscopy tissue sampling. Patient is agreeable to proceed. We talked the risk of bleeding and pneumothorax. She also has a small paratracheal node that I think needs to be staged with videobronchoscopy endobronchial ultrasound transbronchial needle aspiration. This will help candidacy for surgical resection. She is also going to work on smoking cessation in the meantime. She will need a nuclear medicine PET scan She will also need a PFT completed before surgical evaluation.  We will tentatively set bronchoscopy date for 09/10/2021. Patient is agreeable to this plan. N.p.o. at midnight night before. Not on blood thinners or any antiplatelets at this time.  Additional time spent reviewing CT imaging with the patient today in the office.  And discussing all possible neck steps and algorithm for diagnostic approach.   Current Outpatient Medications:    Dupilumab (DUPIXENT) 300 MG/2ML SOPN, Inject 300 mg into the skin., Disp: , Rfl:    albuterol (VENTOLIN HFA) 108 (90 Base) MCG/ACT inhaler, Inhale 1-2 puffs into the lungs every 6 (six) hours as needed for wheezing or shortness of breath. (  Patient not taking: Reported on 09/05/2021), Disp: 18 g, Rfl: 0   benzonatate (TESSALON) 200 MG capsule, Take 1 capsule (200 mg total) by mouth 3 (three) times daily as needed for cough. (Patient not taking: Reported on 09/05/2021), Disp: 20 capsule, Rfl: 0    fluticasone (FLONASE) 50 MCG/ACT nasal spray, Place 2 sprays into both nostrils daily. (Patient not taking: Reported on 09/05/2021), Disp: 16 mL, Rfl: 0   predniSONE (STERAPRED UNI-PAK 21 TAB) 10 MG (21) TBPK tablet, Take by mouth daily. Take 6 tabs by mouth daily  for 2 days, then 5 tabs for 2 days, then 4 tabs for 2 days, then 3 tabs for 2 days, 2 tabs for 2 days, then 1 tab by mouth daily for 2 days (Patient not taking: Reported on 09/05/2021), Disp: 42 tablet, Rfl: 0  I spent 63 minutes dedicated to the care of this patient on the date of this encounter to include pre-visit review of records, face-to-face time with the patient discussing conditions above, post visit ordering of testing, clinical documentation with the electronic health record, making appropriate referrals as documented, and communicating necessary findings to members of the patients care team.   Garner Nash, DO [facility] 09/05/2021 2:32 PM

## 2021-09-05 NOTE — Progress Notes (Signed)
Synopsis: Referred in August 2023 for lung nodule by [facility]  Subjective:   PATIENT ID: Anna Roth: female DOB: 1960-07-12, MRN: [id number]  Chief Complaint  Patient presents with   Consult    Consult: Lung nodule    This is a 61 year old female recently went to the emergency room with leg pain.  Had a history of blood clot several years ago and thrombophlebitis of the leg.  She had of having a CTA of the chest for evaluation of PE which was negative.  However this revealed a right upper lobe pulmonary nodule that was concerning for malignancy.  Patient was found to have a 17 mm lesion.  There is also a small paratracheal node on CT imaging.  Patient was referred here for consideration of bronchoscopy and biopsy.  Patient is agreeable to proceed.  We talked about the risk benefits and alternatives today in the office.  She is still smoking.  She has smoked since she was a teenager.  We talked about smoke cessation counseling.  She has used Chantix in the past but did not like it because it caused vivid dreams.    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[place])    H/O: Bell's palsy    Hypertension      Family History  Problem Relation Age of Onset   Diabetes Mother    Stroke Sister    Cancer Paternal Aunt        stomach   Cancer Maternal Grandmother        stomach   Cancer Paternal Grandmother        stomach      Past Surgical History:  Procedure Laterality Date   BLADDER SUSPENSION     CARPAL TUNNEL RELEASE Bilateral    neck fusion     x4    Social History   Socioeconomic History   Marital status: Married    Spouse name: Not on file   Number of children: Not on file   Years of education: Not on file   Highest education level: Not on file  Occupational History   Not on file  Tobacco Use   Smoking status: Every Day    Packs/day: 0.50    Types: Cigarettes   Smokeless tobacco: Never   Tobacco comments:     Smokes 4 packs of cigarettes a week. 09/05/2021 Tay  Vaping Use   Vaping Use: Never used  Substance and Sexual Activity   Alcohol use: Yes    Comment: glass of wine with dinner   Drug use: Never   Sexual activity: Never  Other Topics Concern   Not on file  Social History Narrative   ** Merged History Encounter **       Social Determinants of Health   Financial Resource Strain: Not on file  Food Insecurity: Not on file  Transportation Needs: Not on file  Physical Activity: Not on file  Stress: Not on file  Social Connections: Not on file  Intimate Partner Violence: Not on file     No Known Allergies   Outpatient Medications Prior to Visit  Medication Sig Dispense Refill   Dupilumab (DUPIXENT) 300 MG/2ML SOPN Inject 300 mg into the skin.     albuterol (VENTOLIN HFA) 108 (90 Base) MCG/ACT inhaler Inhale 1-2 puffs into the lungs every 6 (six) hours as needed for wheezing or shortness of breath. (Patient not taking: Reported on 09/05/2021) 18 g 0  benzonatate (TESSALON) 200 MG capsule Take 1 capsule (200 mg total) by mouth 3 (three) times daily as needed for cough. (Patient not taking: Reported on 09/05/2021) 20 capsule 0   fluticasone (FLONASE) 50 MCG/ACT nasal spray Place 2 sprays into both nostrils daily. (Patient not taking: Reported on 09/05/2021) 16 mL 0   predniSONE (STERAPRED UNI-PAK 21 TAB) 10 MG (21) TBPK tablet Take by mouth daily. Take 6 tabs by mouth daily  for 2 days, then 5 tabs for 2 days, then 4 tabs for 2 days, then 3 tabs for 2 days, 2 tabs for 2 days, then 1 tab by mouth daily for 2 days (Patient not taking: Reported on 09/05/2021) 42 tablet 0   No facility-administered medications prior to visit.    Review of Systems  Constitutional:  Negative for chills, fever, malaise/fatigue and weight loss.  HENT:  Negative for hearing loss, sore throat and tinnitus.   Eyes:  Negative for blurred vision and double vision.  Respiratory:  Negative for cough, hemoptysis, sputum  production, shortness of breath, wheezing and stridor.   Cardiovascular:  Negative for chest pain, palpitations, orthopnea, leg swelling and PND.  Gastrointestinal:  Negative for abdominal pain, constipation, diarrhea, heartburn, nausea and vomiting.  Genitourinary:  Negative for dysuria, hematuria and urgency.  Musculoskeletal:  Negative for joint pain and myalgias.  Skin:  Negative for itching and rash.  Neurological:  Negative for dizziness, tingling, weakness and headaches.  Endo/Heme/Allergies:  Negative for environmental allergies. Does not bruise/bleed easily.  Psychiatric/Behavioral:  Negative for depression. The patient is not nervous/anxious and does not have insomnia.   All other systems reviewed and are negative.    Objective:  Physical Exam Vitals reviewed.  Constitutional:      General: She is not in acute distress.    Appearance: She is well-developed.  HENT:     Head: Normocephalic and atraumatic.  Eyes:     General: No scleral icterus.    Conjunctiva/sclera: Conjunctivae normal.     Pupils: Pupils are equal, round, and reactive to light.  Neck:     Vascular: No JVD.     Trachea: No tracheal deviation.  Cardiovascular:     Rate and Rhythm: Normal rate and regular rhythm.     Heart sounds: Normal heart sounds. No murmur heard. Pulmonary:     Effort: Pulmonary effort is normal. No tachypnea, accessory muscle usage or respiratory distress.     Breath sounds: No stridor. No wheezing, rhonchi or rales.  Abdominal:     General: There is no distension.     Palpations: Abdomen is soft.     Tenderness: There is no abdominal tenderness.  Musculoskeletal:        General: No tenderness.     Cervical back: Neck supple.  Lymphadenopathy:     Cervical: No cervical adenopathy.  Skin:    General: Skin is warm and dry.     Capillary Refill: Capillary refill takes less than 2 seconds.     Findings: No rash.  Neurological:     Mental Status: She is alert and oriented to  person, place, and time.  Psychiatric:        Behavior: Behavior normal.      Vitals:   09/05/21 1400  BP: (!) 160/100  Pulse: 92  SpO2: 93%  Weight: 194 lb 12.8 oz (88.4 kg)  Height: 5\' 7"  (1.702 m)   93% on RA BMI Readings from Last 3 Encounters:  09/05/21 30.51 kg/m  09/03/21 28.52 kg/m  01/20/20 28.51 kg/m   Wt Readings from Last 3 Encounters:  09/05/21 194 lb 12.8 oz (88.4 kg)  09/03/21 182 lb 1.6 oz (82.6 kg)  01/20/20 182 lb (82.6 kg)     CBC    Component Value Date/Time   WBC 10.9 (H) 09/03/2021 1635   RBC 5.39 (H) 09/03/2021 1635   HGB 15.2 (H) 09/03/2021 1635   HGB 15.4 05/27/2016 1026   HCT 45.1 09/03/2021 1635   HCT 46.7 (H) 05/27/2016 1026   PLT 213 09/03/2021 1635   PLT 248 05/27/2016 1026   MCV 83.7 09/03/2021 1635   MCV 82 05/27/2016 1026   MCH 28.2 09/03/2021 1635   MCHC 33.7 09/03/2021 1635   RDW 15.6 (H) 09/03/2021 1635   RDW 14.6 05/27/2016 1026   LYMPHSABS 3.9 09/03/2021 1635   MONOABS 0.8 09/03/2021 1635   EOSABS 0.2 09/03/2021 1635   BASOSABS 0.1 09/03/2021 1635     Chest Imaging: August 2023 CT chest: Recent CT scan of the chest with concern for right upper lobe 1.7 cm pulmonary nodule concerning for primary bronchogenic carcinoma.  Patient also has small paratracheal node. The patient's images have been independently reviewed by me.    Pulmonary Functions Testing Results:     No data to display          FeNO:   Pathology:   Echocardiogram:   Heart Catheterization:     Assessment & Plan:     ICD-10-CM   1. Lung mass  R91.8 Procedural/ Surgical Case Request: ROBOTIC ASSISTED NAVIGATIONAL BRONCHOSCOPY, VIDEO BRONCHOSCOPY WITH ENDOBRONCHIAL ULTRASOUND    Ambulatory referral to Pulmonology    NM PET Image Initial (PI) Skull Base To Thigh (F-18 FDG)    Pulmonary Function Test    2. Adenopathy  R59.9 Procedural/ Surgical Case Request: ROBOTIC ASSISTED NAVIGATIONAL BRONCHOSCOPY, VIDEO BRONCHOSCOPY WITH ENDOBRONCHIAL  ULTRASOUND    Ambulatory referral to Pulmonology    NM PET Image Initial (PI) Skull Base To Thigh (F-18 FDG)    Pulmonary Function Test      Discussion:  This is a 61 year old female found to have a incidental right upper lobe 1.7 cm pulmonary nodule concerning for primary bronchogenic carcinoma.  She is a longstanding history of smoking since a teenager.  She is still smoking daily.  She was counseled today on smoking cessation.  Plan: Today in the office we discussed the risk benefits and alternatives of proceeding with robotic assisted navigation bronchoscopy tissue sampling. Patient is agreeable to proceed. We talked the risk of bleeding and pneumothorax. She also has a small paratracheal node that I think needs to be staged with videobronchoscopy endobronchial ultrasound transbronchial needle aspiration. This will help candidacy for surgical resection. She is also going to work on smoking cessation in the meantime. She will need a nuclear medicine PET scan She will also need a PFT completed before surgical evaluation.  We will tentatively set bronchoscopy date for 09/10/2021. Patient is agreeable to this plan. N.p.o. at midnight night before. Not on blood thinners or any antiplatelets at this time.  Additional time spent reviewing CT imaging with the patient today in the office.  And discussing all possible neck steps and algorithm for diagnostic approach.   Current Outpatient Medications:    Dupilumab (DUPIXENT) 300 MG/2ML SOPN, Inject 300 mg into the skin., Disp: , Rfl:    albuterol (VENTOLIN HFA) 108 (90 Base) MCG/ACT inhaler, Inhale 1-2 puffs into the lungs every 6 (six) hours as needed for wheezing or shortness of breath. (  Patient not taking: Reported on 09/05/2021), Disp: 18 g, Rfl: 0   benzonatate (TESSALON) 200 MG capsule, Take 1 capsule (200 mg total) by mouth 3 (three) times daily as needed for cough. (Patient not taking: Reported on 09/05/2021), Disp: 20 capsule, Rfl: 0    fluticasone (FLONASE) 50 MCG/ACT nasal spray, Place 2 sprays into both nostrils daily. (Patient not taking: Reported on 09/05/2021), Disp: 16 mL, Rfl: 0   predniSONE (STERAPRED UNI-PAK 21 TAB) 10 MG (21) TBPK tablet, Take by mouth daily. Take 6 tabs by mouth daily  for 2 days, then 5 tabs for 2 days, then 4 tabs for 2 days, then 3 tabs for 2 days, 2 tabs for 2 days, then 1 tab by mouth daily for 2 days (Patient not taking: Reported on 09/05/2021), Disp: 42 tablet, Rfl: 0  I spent 63 minutes dedicated to the care of this patient on the date of this encounter to include pre-visit review of records, face-to-face time with the patient discussing conditions above, post visit ordering of testing, clinical documentation with the electronic health record, making appropriate referrals as documented, and communicating necessary findings to members of the patients care team.   Garner Nash, DO [facility] 09/05/2021 2:32 PM

## 2021-09-06 ENCOUNTER — Other Ambulatory Visit: Payer: Self-pay

## 2021-09-06 ENCOUNTER — Other Ambulatory Visit: Payer: Self-pay | Admitting: Pulmonary Disease

## 2021-09-06 DIAGNOSIS — R918 Other nonspecific abnormal finding of lung field: Secondary | ICD-10-CM

## 2021-09-06 LAB — SARS CORONAVIRUS 2 (TAT 6-24 HRS): SARS Coronavirus 2: NEGATIVE

## 2021-09-09 ENCOUNTER — Other Ambulatory Visit: Payer: Self-pay

## 2021-09-09 ENCOUNTER — Encounter (HOSPITAL_COMMUNITY): Payer: Self-pay | Admitting: Pulmonary Disease

## 2021-09-09 NOTE — Progress Notes (Signed)
PCP - Dr Clotilde Dieter  EKG - 09/05/21  ERAS Protcol - Clears until 1145  COVID TEST- 09/06/21 Negative  Anesthesia review: N  Patient verbally denies any shortness of breath, fever, cough and chest pain during phone call   -------------  SDW INSTRUCTIONS given:  Your procedure is scheduled on 09/10/21.  Report to [facility] Entrance "A" at 1215 P.M., and check in at the Admitting office.  Call this number if you have problems the morning of surgery:  [phone]   Remember:  Do not eat after midnight the night before your surgery  You may drink clear liquids until 1145 the morning of your surgery.   Clear liquids allowed are: Water, Non-Citrus Juices (without pulp), Carbonated Beverages, Clear Tea, Black Coffee Only, and Gatorade    Take these medicines the morning of surgery with A SIP OF WATER  N/A  As of today, STOP taking any Aspirin (unless otherwise instructed by your surgeon) Aleve, Naproxen, Ibuprofen, Motrin, Advil, Goody's, BC's, all herbal medications, fish oil, and all vitamins.                      Do not wear jewelry, make up, or nail polish            Do not wear lotions, powders, perfumes/colognes, or deodorant.            Do not shave 48 hours prior to surgery.  Men may shave face and neck.            Do not bring valuables to the hospital.            [facility] is not responsible for any belongings or valuables.  Do NOT Smoke (Tobacco/Vaping) 24 hours prior to your procedure If you use a CPAP at night, you may bring all equipment for your overnight stay.   Contacts, glasses, dentures or bridgework may not be worn into surgery.      For patients admitted to the hospital, discharge time will be determined by your treatment team.   Patients discharged the day of surgery will not be allowed to drive home, and someone needs to stay with them for 24 hours.    Special instructions:   [facility]- Preparing For Surgery  Before surgery, you can play an  important role. Because skin is not sterile, your skin needs to be as free of germs as possible. You can reduce the number of germs on your skin by washing with CHG (chlorahexidine gluconate) Soap before surgery.  CHG is an antiseptic cleaner which kills germs and bonds with the skin to continue killing germs even after washing.    Oral Hygiene is also important to reduce your risk of infection.  Remember - BRUSH YOUR TEETH THE MORNING OF SURGERY WITH YOUR REGULAR TOOTHPASTE  Please do not use if you have an allergy to CHG or antibacterial soaps. If your skin becomes reddened/irritated stop using the CHG.  Do not shave (including legs and underarms) for at least 48 hours prior to first CHG shower. It is OK to shave your face.  Please follow these instructions carefully.   Shower the NIGHT BEFORE SURGERY and the MORNING OF SURGERY with DIAL Soap.   Pat yourself dry with a CLEAN TOWEL.  Wear CLEAN PAJAMAS to bed the night before surgery  Place CLEAN SHEETS on your bed the night of your first shower and DO NOT SLEEP WITH PETS.   Day of Surgery: Please shower morning  of surgery  Wear Clean/Comfortable clothing the morning of surgery Do not apply any deodorants/lotions.   Remember to brush your teeth WITH YOUR REGULAR TOOTHPASTE.   Questions were answered. Patient verbalized understanding of instructions.

## 2021-09-10 ENCOUNTER — Encounter (HOSPITAL_COMMUNITY)
Admission: RE | Disposition: A | Discharge status: Home / Self Care | Payer: Self-pay | Source: Home / Self Care | Attending: Pulmonary Disease

## 2021-09-10 ENCOUNTER — Ambulatory Visit (HOSPITAL_COMMUNITY)
Admission: RE | Admit: 2021-09-10 | Discharge: 2021-09-10 | Disposition: A | Discharge status: Home / Self Care | Payer: 59 | Attending: Pulmonary Disease | Admitting: Pulmonary Disease

## 2021-09-10 ENCOUNTER — Ambulatory Visit (HOSPITAL_COMMUNITY): Payer: 59

## 2021-09-10 ENCOUNTER — Encounter (HOSPITAL_COMMUNITY): Payer: Self-pay | Admitting: Pulmonary Disease

## 2021-09-10 ENCOUNTER — Ambulatory Visit (HOSPITAL_BASED_OUTPATIENT_CLINIC_OR_DEPARTMENT_OTHER): Payer: 59 | Admitting: Anesthesiology

## 2021-09-10 ENCOUNTER — Ambulatory Visit (HOSPITAL_COMMUNITY): Payer: 59 | Admitting: Anesthesiology

## 2021-09-10 ENCOUNTER — Telehealth: Payer: Self-pay | Admitting: Pulmonary Disease

## 2021-09-10 DIAGNOSIS — I10 Essential (primary) hypertension: Secondary | ICD-10-CM | POA: Insufficient documentation

## 2021-09-10 DIAGNOSIS — R599 Enlarged lymph nodes, unspecified: Secondary | ICD-10-CM | POA: Diagnosis present

## 2021-09-10 DIAGNOSIS — R911 Solitary pulmonary nodule: Secondary | ICD-10-CM

## 2021-09-10 DIAGNOSIS — C3411 Malignant neoplasm of upper lobe, right bronchus or lung: Secondary | ICD-10-CM | POA: Insufficient documentation

## 2021-09-10 DIAGNOSIS — J449 Chronic obstructive pulmonary disease, unspecified: Secondary | ICD-10-CM | POA: Insufficient documentation

## 2021-09-10 DIAGNOSIS — Z86718 Personal history of other venous thrombosis and embolism: Secondary | ICD-10-CM | POA: Diagnosis not present

## 2021-09-10 DIAGNOSIS — R918 Other nonspecific abnormal finding of lung field: Secondary | ICD-10-CM | POA: Diagnosis present

## 2021-09-10 DIAGNOSIS — F17210 Nicotine dependence, cigarettes, uncomplicated: Secondary | ICD-10-CM | POA: Diagnosis not present

## 2021-09-10 HISTORY — PX: BRONCHIAL BIOPSY: SHX5109

## 2021-09-10 HISTORY — PX: BRONCHIAL NEEDLE ASPIRATION BIOPSY: SHX5106

## 2021-09-10 HISTORY — DX: Anemia, unspecified: D64.9

## 2021-09-10 HISTORY — DX: Chronic obstructive pulmonary disease, unspecified: J44.9

## 2021-09-10 HISTORY — PX: VIDEO BRONCHOSCOPY WITH ENDOBRONCHIAL ULTRASOUND: SHX6177

## 2021-09-10 HISTORY — PX: FINE NEEDLE ASPIRATION: SHX6590

## 2021-09-10 SURGERY — BRONCHOSCOPY, WITH BIOPSY USING ELECTROMAGNETIC NAVIGATION
Anesthesia: General

## 2021-09-10 MED ORDER — FENTANYL CITRATE (PF) 250 MCG/5ML IJ SOLN
INTRAMUSCULAR | Status: DC | PRN
  Administered 2021-09-10: 100 ug via INTRAVENOUS

## 2021-09-10 MED ORDER — SUCCINYLCHOLINE CHLORIDE 200 MG/10ML IV SOSY
PREFILLED_SYRINGE | INTRAVENOUS | Status: DC | PRN
  Administered 2021-09-10: 100 mg via INTRAVENOUS

## 2021-09-10 MED ORDER — PROPOFOL 500 MG/50ML IV EMUL
INTRAVENOUS | Status: DC | PRN
  Administered 2021-09-10: 150 ug/kg/min via INTRAVENOUS

## 2021-09-10 MED ORDER — ROCURONIUM BROMIDE 10 MG/ML (PF) SYRINGE
PREFILLED_SYRINGE | INTRAVENOUS | Status: DC | PRN
  Administered 2021-09-10: 60 mg via INTRAVENOUS

## 2021-09-10 MED ORDER — MIDAZOLAM HCL 2 MG/2ML IJ SOLN
INTRAMUSCULAR | Status: DC | PRN
  Administered 2021-09-10: 2 mg via INTRAVENOUS

## 2021-09-10 MED ORDER — SUGAMMADEX SODIUM 200 MG/2ML IV SOLN
INTRAVENOUS | Status: DC | PRN
  Administered 2021-09-10: 200 mg via INTRAVENOUS

## 2021-09-10 MED ORDER — LIDOCAINE 2% (20 MG/ML) 5 ML SYRINGE
INTRAMUSCULAR | Status: DC | PRN
  Administered 2021-09-10: 60 mg via INTRAVENOUS

## 2021-09-10 MED ORDER — PROPOFOL 10 MG/ML IV BOLUS
INTRAVENOUS | Status: DC | PRN
  Administered 2021-09-10: 140 mg via INTRAVENOUS

## 2021-09-10 MED ORDER — ONDANSETRON HCL 4 MG/2ML IJ SOLN
INTRAMUSCULAR | Status: DC | PRN
  Administered 2021-09-10: 4 mg via INTRAVENOUS

## 2021-09-10 MED ORDER — ACETAMINOPHEN 500 MG PO TABS
1000.0000 mg | ORAL_TABLET | Freq: Once | ORAL | Status: AC
  Administered 2021-09-10: 1000 mg via ORAL
  Filled 2021-09-10: qty 2

## 2021-09-10 MED ORDER — CHLORHEXIDINE GLUCONATE 0.12 % MT SOLN
OROMUCOSAL | Status: AC
  Administered 2021-09-10: 15 mL
  Filled 2021-09-10: qty 15

## 2021-09-10 MED ORDER — DEXAMETHASONE SODIUM PHOSPHATE 10 MG/ML IJ SOLN
INTRAMUSCULAR | Status: DC | PRN
  Administered 2021-09-10: 10 mg via INTRAVENOUS

## 2021-09-10 MED ORDER — LACTATED RINGERS IV SOLN: INTRAVENOUS | Status: DC

## 2021-09-10 MED ORDER — CHLORHEXIDINE GLUCONATE 0.12 % MT SOLN
15.0000 mL | Freq: Once | OROMUCOSAL | Status: AC
Start: 2021-09-10 — End: 2021-09-10
  Filled 2021-09-10: qty 15

## 2021-09-10 SURGICAL SUPPLY — 30 items
BRUSH CYTOL CELLEBRITY 1.5X140 (MISCELLANEOUS) IMPLANT
CANISTER SUCT 3000ML PPV (MISCELLANEOUS) ×4 IMPLANT
CONT SPEC 4OZ CLIKSEAL STRL BL (MISCELLANEOUS) ×4 IMPLANT
COVER BACK TABLE 60X90IN (DRAPES) ×4 IMPLANT
COVER DOME SNAP 22 D (MISCELLANEOUS) ×4 IMPLANT
FORCEPS BIOP RJ4 1.8 (CUTTING FORCEPS) IMPLANT
GAUZE SPONGE 4X4 12PLY STRL (GAUZE/BANDAGES/DRESSINGS) ×4 IMPLANT
GLOVE BIO SURGEON STRL SZ7.5 (GLOVE) ×4 IMPLANT
GOWN STRL REUS W/ TWL LRG LVL3 (GOWN DISPOSABLE) ×3 IMPLANT
GOWN STRL REUS W/TWL LRG LVL3 (GOWN DISPOSABLE) ×1
KIT CLEAN ENDO COMPLIANCE (KITS) ×8 IMPLANT
KIT TURNOVER KIT B (KITS) ×4 IMPLANT
MARKER SKIN DUAL TIP RULER LAB (MISCELLANEOUS) ×4 IMPLANT
NDL EBUS SONO TIP PENTAX (NEEDLE) ×3 IMPLANT
NEEDLE EBUS SONO TIP PENTAX (NEEDLE) ×4 IMPLANT
NS IRRIG 1000ML POUR BTL (IV SOLUTION) ×4 IMPLANT
OIL SILICONE PENTAX (PARTS (SERVICE/REPAIRS)) ×4 IMPLANT
PAD ARMBOARD 7.5X6 YLW CONV (MISCELLANEOUS) ×8 IMPLANT
SOL ANTI FOG 6CC (MISCELLANEOUS) ×3 IMPLANT
SOLUTION ANTI FOG 6CC (MISCELLANEOUS) ×1
SYR 20CC LL (SYRINGE) ×8 IMPLANT
SYR 20ML ECCENTRIC (SYRINGE) ×8 IMPLANT
SYR 50ML SLIP (SYRINGE) IMPLANT
SYR 5ML LUER SLIP (SYRINGE) ×4 IMPLANT
TOWEL OR 17X24 6PK STRL BLUE (TOWEL DISPOSABLE) ×4 IMPLANT
TRAP SPECIMEN MUCOUS 40CC (MISCELLANEOUS) IMPLANT
TUBE CONNECTING 20X1/4 (TUBING) ×8 IMPLANT
UNDERPAD 30X30 (UNDERPADS AND DIAPERS) ×4 IMPLANT
VALVE DISPOSABLE (MISCELLANEOUS) ×4 IMPLANT
WATER STERILE IRR 1000ML POUR (IV SOLUTION) ×4 IMPLANT

## 2021-09-10 NOTE — Anesthesia Procedure Notes (Signed)
Procedure Name: Intubation Date/Time: 09/10/2021 2:28 PM  Performed by: Inda Coke, CRNAPre-anesthesia Checklist: Patient identified, Emergency Drugs available, Suction available, Timeout performed and Patient being monitored Patient Re-evaluated:Patient Re-evaluated prior to induction Oxygen Delivery Method: Circle system utilized Preoxygenation: Pre-oxygenation with 100% oxygen Induction Type: IV induction and Rapid sequence Ventilation: Mask ventilation without difficulty Laryngoscope Size: Mac and 3 Grade View: Grade I Tube type: Oral Tube size: 8.5 mm Number of attempts: 1 Airway Equipment and Method: Stylet Placement Confirmation: ETT inserted through vocal cords under direct vision, positive ETCO2, CO2 detector and breath sounds checked- equal and bilateral Secured at: 20 cm Tube secured with: Tape Dental Injury: Teeth and Oropharynx as per pre-operative assessment

## 2021-09-10 NOTE — Anesthesia Postprocedure Evaluation (Signed)
Anesthesia Post Note  Patient: Anna Roth  Procedure(s) Performed: ROBOTIC ASSISTED NAVIGATIONAL BRONCHOSCOPY VIDEO BRONCHOSCOPY WITH ENDOBRONCHIAL ULTRASOUND (Bilateral) BRONCHIAL BIOPSIES BRONCHIAL NEEDLE ASPIRATION BIOPSIES FINE NEEDLE ASPIRATION     Patient location during evaluation: PACU Anesthesia Type: General Level of consciousness: awake and alert Pain management: pain level controlled Vital Signs Assessment: post-procedure vital signs reviewed and stable Respiratory status: spontaneous breathing, nonlabored ventilation, respiratory function stable and patient connected to nasal cannula oxygen Cardiovascular status: blood pressure returned to baseline and stable Postop Assessment: no apparent nausea or vomiting Anesthetic complications: no   No notable events documented.  Last Vitals:  Vitals:   09/10/21 1600 09/10/21 1610  BP: (!) 142/66 136/71  Pulse: 70 63  Resp: 20 (!) 22  Temp:    SpO2: 97% 93%    Last Pain:  Vitals:   09/10/21 1610  TempSrc:   PainSc: 0-No pain                 Deltha Bernales S

## 2021-09-10 NOTE — Discharge Instructions (Signed)
Flexible Bronchoscopy, Care After This sheet gives you information about how to care for yourself after your test. Your doctor may also give you more specific instructions. If you have problems or questions, contact your doctor. Follow these instructions at home: Eating and drinking Do not eat or drink anything (not even water) for 2 hours after your test, or until your numbing medicine (local anesthetic) wears off. When your numbness is gone and your cough and gag reflexes have come back, you may: Eat only soft foods. Slowly drink liquids. The day after the test, go back to your normal diet. Driving Do not drive for 24 hours if you were given a medicine to help you relax (sedative). Do not drive or use heavy machinery while taking prescription pain medicine. General instructions  Take over-the-counter and prescription medicines only as told by your doctor. Return to your normal activities as told. Ask what activities are safe for you. Do not use any products that have nicotine or tobacco in them. This includes cigarettes and e-cigarettes. If you need help quitting, ask your doctor. Keep all follow-up visits as told by your doctor. This is important. It is very important if you had a tissue sample (biopsy) taken. Get help right away if: You have shortness of breath that gets worse. You get light-headed. You feel like you are going to pass out (faint). You have chest pain. You cough up: More than a little blood. More blood than before. Summary Do not eat or drink anything (not even water) for 2 hours after your test, or until your numbing medicine wears off. Do not use cigarettes. Do not use e-cigarettes. Get help right away if you have chest pain.  This information is not intended to replace advice given to you by your health care provider. Make sure you discuss any questions you have with your health care provider. Document Released: 11/10/2008 Document Revised: 12/26/2016 Document  Reviewed: 02/01/2016 Elsevier Patient Education  2020 [employer].

## 2021-09-10 NOTE — Telephone Encounter (Signed)
Pt scheduled for PFT on 09/12/21 at 4 pm.

## 2021-09-10 NOTE — Transfer of Care (Signed)
Immediate Anesthesia Transfer of Care Note  Patient: Anna Roth  Procedure(s) Performed: ROBOTIC ASSISTED NAVIGATIONAL BRONCHOSCOPY VIDEO BRONCHOSCOPY WITH ENDOBRONCHIAL ULTRASOUND (Bilateral) BRONCHIAL BIOPSIES BRONCHIAL NEEDLE ASPIRATION BIOPSIES FINE NEEDLE ASPIRATION  Patient Location: PACU  Anesthesia Type:General  Level of Consciousness: awake and alert   Airway & Oxygen Therapy: Patient Spontanous Breathing and Patient connected to face mask oxygen  Post-op Assessment: Report given to RN and Post -op Vital signs reviewed and stable  Post vital signs: Reviewed and stable  Last Vitals:  Vitals Value Taken Time  BP    Temp    Pulse    Resp    SpO2      Last Pain:  Vitals:   09/10/21 1245  TempSrc:   PainSc: 0-No pain         Complications: No notable events documented.

## 2021-09-10 NOTE — Interval H&P Note (Signed)
History and Physical Interval Note:  09/10/2021 2:08 PM  Anna Roth  has presented today for surgery, with the diagnosis of lun gnodule, adenopathy.  The various methods of treatment have been discussed with the patient and family. After consideration of risks, benefits and other options for treatment, the patient has consented to  Procedure(s) with comments: ROBOTIC ASSISTED NAVIGATIONAL BRONCHOSCOPY (N/A) - ION w/ CIOS VIDEO BRONCHOSCOPY WITH ENDOBRONCHIAL ULTRASOUND (Bilateral) as a surgical intervention.  The patient's history has been reviewed, patient examined, no change in status, stable for surgery.  I have reviewed the patient's chart and labs.  Questions were answered to the patient's satisfaction.     [facility]

## 2021-09-10 NOTE — Op Note (Signed)
Video Bronchoscopy with Robotic Assisted Bronchoscopic Navigation  Video Bronchoscopy with Endobronchial Ultrasound Procedure Note  Date of Operation: 09/10/2021   Pre-op Diagnosis: Right upper lobe pulmonary nodule  Post-op Diagnosis: Right upper lobe pulmonary nodule  Surgeon: Garner Nash, DO   Assistants: None   Anesthesia: General endotracheal anesthesia  Operation: Flexible video fiberoptic bronchoscopy with robotic assistance and biopsies.  Estimated Blood Loss: Minimal  Complications: None  Indications and History: Anna Roth is a 61 y.o. female with history of right upper lobe pulmonary nodule. The risks, benefits, complications, treatment options and expected outcomes were discussed with the patient.  The possibilities of pneumothorax, pneumonia, reaction to medication, pulmonary aspiration, perforation of a viscus, bleeding, failure to diagnose a condition and creating a complication requiring transfusion or operation were discussed with the patient who freely signed the consent.    Description of Procedure: The patient was seen in the Preoperative Area, was examined and was deemed appropriate to proceed.  The patient was taken to [facility] endoscopy room 3, identified as Anna Roth and the procedure verified as Flexible Video Fiberoptic Bronchoscopy.  A Time Out was held and the above information confirmed.   Prior to the date of the procedure a high-resolution CT scan of the chest was performed. Utilizing ION software program a virtual tracheobronchial tree was generated to allow the creation of distinct navigation pathways to the patient's parenchymal abnormalities. After being taken to the operating room general anesthesia was initiated and the patient  was orally intubated. The video fiberoptic bronchoscope was introduced via the endotracheal tube and a general inspection was performed which showed normal right and left lung anatomy, aspiration of the bilateral mainstems was  completed to remove any remaining secretions. Robotic catheter inserted into patient's endotracheal tube.   Target #1 right upper lobe: The distinct navigation pathways prepared prior to this procedure were then utilized to navigate to patient's lesion identified on CT scan. The robotic catheter was secured into place and the vision probe was withdrawn.  Lesion location was approximated using fluoroscopy and three-dimensional cone beam CT imaging. Under fluoroscopic guidance transbronchial needle brushings, transbronchial needle biopsies, and transbronchial forceps biopsies were performed to be sent for cytology and pathology.   Target #2 endobronchial ultrasound, station 4R: The standard scope was then withdrawn and the endobronchial ultrasound was used to identify and characterize the peritracheal, hilar and bronchial lymph nodes. Inspection showed no significant adenopathy within the hilum or subcarinal space, small subcentimeter lymph node within 4R. Using real-time ultrasound guidance Wang needle biopsies were take from Station 4R nodes and were sent for cytology. The patient tolerated the procedure well without apparent complications. There was no significant blood loss.  At the end of the procedure a general airway inspection was performed and there was no evidence of active bleeding. The bronchoscope was removed.  The patient tolerated the procedure well. There was no significant blood loss and there were no obvious complications. A post-procedural chest x-ray is pending.  Samples Target #1: 1. Transbronchial Wang needle biopsies from right upper lobe 2. Transbronchial forceps biopsies from right upper lobe  Samples Target #2: 1. Wang needle biopsies from 4R node  Plans:  The patient will be discharged from the PACU to home when recovered from anesthesia and after chest x-ray is reviewed. We will review the cytology, pathology and microbiology results with the patient when they become  available. Outpatient followup will be with Octavio Graves Daulton Harbaugh , DO.   Garner Nash, DO  [facility] 09/10/2021 3:33 PM

## 2021-09-10 NOTE — Anesthesia Preprocedure Evaluation (Addendum)
Anesthesia Evaluation  Patient identified by MRN, date of birth, ID band Patient awake    Reviewed: Allergy & Precautions, H&P , NPO status , Patient's Chart, lab work & pertinent test results  Airway Mallampati: II  TM Distance: >3 FB Neck ROM: Full    Dental no notable dental hx. (+) Teeth Intact, Dental Advisory Given   Pulmonary COPD, Current Smoker and Patient abstained from smoking.,    Pulmonary exam normal breath sounds clear to auscultation       Cardiovascular hypertension, negative cardio ROS   Rhythm:Regular Rate:Normal     Neuro/Psych negative neurological ROS  negative psych ROS   GI/Hepatic negative GI ROS, Neg liver ROS,   Endo/Other  negative endocrine ROS  Renal/GU negative Renal ROS  negative genitourinary   Musculoskeletal   Abdominal   Peds  Hematology  (+) Blood dyscrasia, anemia ,   Anesthesia Other Findings   Reproductive/Obstetrics negative OB ROS                            Anesthesia Physical Anesthesia Plan  ASA: 2  Anesthesia Plan: General   Post-op Pain Management: Tylenol PO (pre-op)*   Induction: Intravenous  PONV Risk Score and Plan: 3 and Ondansetron, Dexamethasone, Propofol infusion, TIVA and Midazolam  Airway Management Planned: Oral ETT  Additional Equipment:   Intra-op Plan:   Post-operative Plan: Extubation in OR  Informed Consent: I have reviewed the patients History and Physical, chart, labs and discussed the procedure including the risks, benefits and alternatives for the proposed anesthesia with the patient or authorized representative who has indicated his/her understanding and acceptance.     Dental advisory given  Plan Discussed with: CRNA  Anesthesia Plan Comments:         Anesthesia Quick Evaluation

## 2021-09-11 ENCOUNTER — Encounter (HOSPITAL_COMMUNITY): Payer: Self-pay | Admitting: Pulmonary Disease

## 2021-09-12 ENCOUNTER — Ambulatory Visit (HOSPITAL_COMMUNITY)
Admission: RE | Admit: 2021-09-12 | Discharge: 2021-09-12 | Disposition: A | Discharge status: Home / Self Care | Payer: 59 | Source: Ambulatory Visit | Attending: Pulmonary Disease | Admitting: Pulmonary Disease

## 2021-09-12 ENCOUNTER — Ambulatory Visit (INDEPENDENT_AMBULATORY_CARE_PROVIDER_SITE_OTHER): Payer: 59 | Admitting: Pulmonary Disease

## 2021-09-12 DIAGNOSIS — R599 Enlarged lymph nodes, unspecified: Secondary | ICD-10-CM

## 2021-09-12 DIAGNOSIS — R918 Other nonspecific abnormal finding of lung field: Secondary | ICD-10-CM | POA: Diagnosis present

## 2021-09-12 LAB — CYTOLOGY - NON PAP

## 2021-09-12 LAB — GLUCOSE, CAPILLARY: Glucose-Capillary: 97 mg/dL (ref 70–99)

## 2021-09-12 MED ORDER — FLUDEOXYGLUCOSE F - 18 (FDG) INJECTION
9.5700 | Freq: Once | INTRAVENOUS | Status: AC
  Administered 2021-09-12: 9.57 via INTRAVENOUS

## 2021-09-12 NOTE — Progress Notes (Signed)
Full PFT performed today. °

## 2021-09-12 NOTE — Patient Instructions (Signed)
Full PFT performed today. °

## 2021-09-17 ENCOUNTER — Telehealth: Payer: Self-pay | Admitting: Pulmonary Disease

## 2021-09-17 NOTE — Telephone Encounter (Signed)
Returned patient's call, provider hasn't given results. Let patient know once provider completes his review she will be contacted   Nothing further

## 2021-09-17 NOTE — Progress Notes (Signed)
Anna Roth, I called and spoke with the patient regarding pathology results.  Patient has an appointment to see Dr. Kipp Brood on 09/27/2021 to discuss surgical resection.  Please ensure pulmonary function test are completed prior to appointment with Dr. Kipp Brood.  Thanks,  BLI  Garner Nash, DO [facility] 09/17/2021 11:15 AM

## 2021-09-18 LAB — PULMONARY FUNCTION TEST
DL/VA % pred: 108 %
DL/VA: 4.46 ml/min/mmHg/L
DLCO cor % pred: 85 %
DLCO cor: 19.1 ml/min/mmHg
DLCO unc % pred: 90 %
DLCO unc: 20.08 ml/min/mmHg
FEF 25-75 Post: 1.62 L/sec
FEF 25-75 Pre: 1.47 L/sec
FEF2575-%Change-Post: 10 %
FEF2575-%Pred-Post: 65 %
FEF2575-%Pred-Pre: 59 %
FEV1-%Change-Post: 2 %
FEV1-%Pred-Post: 62 %
FEV1-%Pred-Pre: 61 %
FEV1-Post: 1.77 L
FEV1-Pre: 1.72 L
FEV1FVC-%Change-Post: -2 %
FEV1FVC-%Pred-Pre: 98 %
FEV6-%Change-Post: 5 %
FEV6-%Pred-Post: 67 %
FEV6-%Pred-Pre: 63 %
FEV6-Post: 2.37 L
FEV6-Pre: 2.25 L
FEV6FVC-%Pred-Post: 103 %
FEV6FVC-%Pred-Pre: 103 %
FVC-%Change-Post: 5 %
FVC-%Pred-Post: 64 %
FVC-%Pred-Pre: 61 %
FVC-Post: 2.37 L
FVC-Pre: 2.25 L
Post FEV1/FVC ratio: 75 %
Post FEV6/FVC ratio: 100 %
Pre FEV1/FVC ratio: 77 %
Pre FEV6/FVC Ratio: 100 %
RV % pred: 84 %
RV: 1.81 L
TLC % pred: 80 %
TLC: 4.44 L

## 2021-09-19 ENCOUNTER — Other Ambulatory Visit: Payer: Self-pay | Admitting: *Deleted

## 2021-09-19 NOTE — Progress Notes (Signed)
The proposed treatment discussed in conference is for discussion purpose only and is not a binding recommendation.  The patients have not been physically examined, or presented with their treatment options.  Therefore, final treatment plans cannot be decided.  

## 2021-09-24 ENCOUNTER — Encounter: Payer: Self-pay | Admitting: Neurology

## 2021-09-24 ENCOUNTER — Ambulatory Visit (INDEPENDENT_AMBULATORY_CARE_PROVIDER_SITE_OTHER): Payer: 59 | Admitting: Neurology

## 2021-09-24 ENCOUNTER — Telehealth: Payer: Self-pay | Admitting: Neurology

## 2021-09-24 VITALS — BP 155/94 | HR 78 | Ht 67.0 in | Wt 195.0 lb

## 2021-09-24 DIAGNOSIS — R200 Anesthesia of skin: Secondary | ICD-10-CM | POA: Diagnosis not present

## 2021-09-24 DIAGNOSIS — M5416 Radiculopathy, lumbar region: Secondary | ICD-10-CM

## 2021-09-24 DIAGNOSIS — R29898 Other symptoms and signs involving the musculoskeletal system: Secondary | ICD-10-CM

## 2021-09-24 DIAGNOSIS — R202 Paresthesia of skin: Secondary | ICD-10-CM

## 2021-09-24 DIAGNOSIS — M48062 Spinal stenosis, lumbar region with neurogenic claudication: Secondary | ICD-10-CM

## 2021-09-24 DIAGNOSIS — M7918 Myalgia, other site: Secondary | ICD-10-CM | POA: Insufficient documentation

## 2021-09-24 MED ORDER — GABAPENTIN 300 MG PO CAPS: 300.0000 mg | ORAL_CAPSULE | Freq: Three times a day (TID) | ORAL | 11 refills | Status: AC | PRN

## 2021-09-24 NOTE — Progress Notes (Signed)
[facility]    Provider:  Dr Jaynee Eagles Requesting Provider: Lujean Amel, MD Primary Care Provider:  Lujean Amel, MD  CC:  occipital neuralgia  HPI:  Anna Roth is a 61 y.o. female here as requested by Lujean Amel, MD for occipital neuralgia. PMHx  Headache, I reviewed Dr. Roselee Culver notes, headaches on and off for a while now for few years, she thought it was coming from her neck, she had history of fusion of C-spine in 2002 has seen Dr. Arnoldo Morale last year who recommended surgery again given the findings, pain is in the back of the head occipital area feels tightness and denies any frontal headache, she is been a doctor at [facility] Dr. Michelle Piper had stress testing done which apparently was normal, she may have a hypertension blood pressure appears a little elevated per his notes, she had carpal tunnel release in 2015, she does use cigarettes and is a current smoker 1 pack/day but no alcohol, limited caffeine, elevated blood pressure, frequent headaches and tobacco use as past medical history.  His examination seemed to indicate occipital neuralgia possibly relation to prior neck surgery and she wonders about other alternatives for management, hoping neurologist can help with modalities other than surgical options [place] noted.  And she is also working on smoking cessation.   She has pain in the neck but also pain in the left lower leg. The leg is actually worse and bothers her more. She was told she needs surgery. When she is walking leg stops moving, numb and tingly, it is significantly painful, she went to the ED a few weeks ago for extreme pain. She couldn't walk, had to go to ED, she has bowel and bladder changes, we discussed options. Her neck is always tight and cracks. She sometimes has shooting paina nd numbness down the arm if she turns a certain way, she tries to do neck exercises. MRI lumbar spine: lumbar stenosis with claudication, left leg weakness, ongoing >2 years,  was told had stenosis and needed surgery need to check and requesting imaging from [place], worsening. Options include: PT (need new MRI first), medications(Gabapentin), Lidoderm patches (OTC), injections into the low back which would need to go back to [place] neurosurgery depending on results of MRI Lumbar spine. Gabapentin prn. Left leg flexion weakness, left AJ reduced, likely L5/s1 on the left which fits the distribution down the leg to the bottom of the feet..  Chronic neck pain, some occ radiculopathy, exam not worrisome for stenosis, will also add to PT, she denies anything sounding like occipital neuralgia, the pain goes down the arm and that's not really the issue she has a lot of neck tightness more between shoulder blades more myofascial cervical pain. Send to PT to help and dry needling and PT. Denies any pain at the occipital emergence but could be from c2-c4 degenerative changes (where the occipita nerve comes out) causing cervical myofascial pain. Reflexes normal, hoffman's negative, do not suspect cervical stenosis likely degenerative changes with cervical myofascial pain.  Saw Dr. Arnoldo Morale 2 years agoa nd was told she needs surgery, I do not have her images, requesting.  .  Reviewed notes, labs and imaging from outside physicians, which showed:  BMP 09/03/2021 normal  01/20/2020:  CT L Spine: Disc levels: L1-2: Mild disc bulging and moderate facet degeneration. No significant stenosis.   L2-3: Diffuse bulging of the disc and moderate to advanced facet degeneration. Mild spinal stenosis. Moderate right foraminal stenosis and mild left foraminal stenosis.  L3-4: Moderate disc bulging and severe facet hypertrophy bilaterally. Moderate to severe spinal stenosis. Moderate subarticular and foraminal stenosis bilaterally.   L4-5: Moderate bulging of the disc with advanced facet degeneration bilaterally. Moderate spinal stenosis . Mild to moderate subarticular stenosis bilaterally.    L5-S1: Mild disc degeneration. Severe facet hypertrophy on the right causing right subarticular and foraminal stenosis. Mild facet degeneration on the left.   IMPRESSION: Multilevel degenerative change in the lumbar spine primarily involving the facet joints causing spinal and foraminal stenosis as described above  09/12/2021: PET IMPRESSION: 1. Right upper lobe nodule of concern on recent CTA chest is markedly hypermetabolic consistent with primary bronchogenic neoplasm. Low level hypermetabolism seen in the right hilum and associated with a small precarinal lymph node, indeterminate but close attention on follow-up recommended as metastatic disease not excluded. 2. 12 mm superficial subcutaneous nodule anterior left shoulder. This may be infectious/inflammatory and should be amenable to clinical inspection. 3. No unexpected or suspicious hypermetabolism in the neck, abdomen, or pelvis. 4.  Aortic Atherosclerois (ICD10-170.0)  Review of Systems: Patient complains of symptoms per HPI as well as the following symptoms low back pain and neck pain. Pertinent negatives and positives per HPI. All others negative.   Social History   Socioeconomic History   Marital status: Married    Spouse name: Not on file   Number of children: Not on file   Years of education: Not on file   Highest education level: Not on file  Occupational History   Not on file  Tobacco Use   Smoking status: Every Day    Packs/day: 0.50    Types: Cigarettes   Smokeless tobacco: Never   Tobacco comments:    Smokes 4 packs of cigarettes a week. 09/05/2021 [place]  Vaping Use   Vaping Use: Never used  Substance and Sexual Activity   Alcohol use: Yes    Alcohol/week: 14.0 standard drinks of alcohol    Types: 7 Glasses of wine, 7 Shots of liquor per week    Comment: glass of wine and cocktail each night   Drug use: Never   Sexual activity: Never  Other Topics Concern   Not on file  Social History Narrative    Lives at home with spouse, daughter   Right handed   Caffeine: 2 cups/day      ** Merged History Encounter **    Social Determinants of Health   Financial Resource Strain: Not on file  Food Insecurity: Not on file  Transportation Needs: Not on file  Physical Activity: Not on file  Stress: Not on file  Social Connections: Not on file  Intimate Partner Violence: Not on file    Family History  Problem Relation Age of Onset   Diabetes Mother    Stroke Sister    Cancer Paternal Aunt        stomach   Cancer Maternal Grandmother        stomach   Cancer Paternal Grandmother        stomach     Past Medical History:  Diagnosis Date   COPD (chronic obstructive pulmonary disease) ([place])    Deep vein thrombophlebitis of leg ([place])    H/O: Bell's palsy    History of Anemia    during pregnancy   Hypertension    Lung cancer ([place]) 2023    Patient Active Problem List   Diagnosis Date Noted   Cervical myofascial pain syndrome 09/24/2021   Lumbar back pain with radiculopathy affecting left  lower extremity 09/24/2021   Lung mass 09/05/2021   Adenopathy 09/05/2021    Past Surgical History:  Procedure Laterality Date   BLADDER SUSPENSION     BRONCHIAL BIOPSY  09/10/2021   Procedure: BRONCHIAL BIOPSIES;  Surgeon: Garner Nash, DO;  Location: [facility];  Service: Pulmonary;;   BRONCHIAL NEEDLE ASPIRATION BIOPSY  09/10/2021   Procedure: BRONCHIAL NEEDLE ASPIRATION BIOPSIES;  Surgeon: Garner Nash, DO;  Location: [facility];  Service: Pulmonary;;   CARPAL TUNNEL RELEASE Bilateral    FINE NEEDLE ASPIRATION  09/10/2021   Procedure: FINE NEEDLE ASPIRATION;  Surgeon: Garner Nash, DO;  Location: [facility];  Service: Pulmonary;;   neck fusion     x4   VIDEO BRONCHOSCOPY WITH ENDOBRONCHIAL ULTRASOUND Bilateral 09/10/2021   Procedure: VIDEO BRONCHOSCOPY WITH ENDOBRONCHIAL ULTRASOUND;  Surgeon: Garner Nash, DO;  Location: [facility];  Service: Pulmonary;  Laterality:  Bilateral;    Current Outpatient Medications  Medication Sig Dispense Refill   clobetasol ointment (TEMOVATE) 4.78 % Apply 1 Application topically 2 (two) times daily as needed (rash/irritation.).     Dupilumab (DUPIXENT) 300 MG/2ML SOPN Inject 300 mg into the skin every 14 (fourteen) days.     gabapentin (NEURONTIN) 300 MG capsule Take 1 capsule (300 mg total) by mouth 3 (three) times daily as needed. May take 600mg  at bedtime if needed. 90 capsule 11   ibuprofen (ADVIL) 200 MG tablet Take 200 mg by mouth every 8 (eight) hours as needed (pain.).     No current facility-administered medications for this visit.    Allergies as of 09/24/2021   (No Known Allergies)    Vitals: BP (!) 155/94 (BP Location: Right Arm, Patient Position: Sitting)   Pulse 78   Ht 5\' 7"  (1.702 m)   Wt 195 lb (88.5 kg)   BMI 30.54 kg/m  Last Weight:  Wt Readings from Last 1 Encounters:  09/24/21 195 lb (88.5 kg)   Last Height:   Ht Readings from Last 1 Encounters:  09/24/21 5\' 7"  (1.702 m)     Physical exam: Exam: Gen: NAD, conversant, well nourised, obese, well groomed                     CV: RRR, no MRG. No Carotid Bruits. No peripheral edema, warm, nontender Eyes: Conjunctivae clear without exudates or hemorrhage  Neuro: Detailed Neurologic Exam  Speech:    Speech is normal; fluent and spontaneous with normal comprehension.  Cognition:    The patient is oriented to person, place, and time;     recent and remote memory intact;     language fluent;     normal attention, concentration,     fund of knowledge Cranial Nerves:    The pupils are equal, round, and reactive to light. The fundi are flat. Extraocular movements are intact. Trigeminal sensation is intact and the muscles of mastication are normal. The face is symmetric. The palate elevates in the midline. Hearing intact. Voice is normal. Shoulder shrug is normal. The tongue has normal motion without fasciculations.   Coordination:     Normal   Gait:     normal.   Motor Observation:    No asymmetry, no atrophy, and no involuntary movements noted. Tone:    Normal muscle tone.    Posture:    Posture is normal. normal erect    Strength: left leg flexion 4/5. Otherwise strength is V/V in the upper and lower limbs.      Sensation:  intact to LT     Reflex Exam:  DTR's:    Deep tendon reflexes in the upper and lower extremities are normal bilaterally in the uppers.  But the left AJ is reduced. Toes:    The toes are downgoing bilaterally.   Clonus:    Clonus is absent.    Assessment/Plan:  patient with   MRI lumbar spine: lumbar stenosis with claudication, left leg weakness, ongoing >2 years, was told had stenosis and needed surgery need to check and requesting imaging from [place], worsening. Options include: PT (need new MRI first), medications(Gabapentin), Lidoderm patches (OTC), injections into the low back which would need to go back to [place] [facility] depending on results of MRI Lumbar spine. Gabapentin prn. Left leg flexion weakness, left AJ reduced, likely L5/s1 on the left which fits the distribution down the leg to the bottom of the feet.. Also weight loss.  Chronic neck pain, some occ radiculopathy, exam not worrisome for stenosis, will also add to PT, she denies anything sounding like occipital neuralgia, the pain goes down the arm and that's not really the issue she has a lot of neck tightness more between shoulder blades more myofascial cervical pain. Send to PT to help and dry needling and PT. Denies any pain at the occipital emergence but could be from c2-c4 degenerative changes (where the occipita nerve comes out) causing cervical myofascial pain. Reflexes normal, hoffman's negative, do not suspect cervical stenosis likely degenerative changes with cervical myofascial pain.   Has lung nodule, being evaluated, surgery, no indication at this time for metastatic disease into the spine or neck has these  issues for years and sounds like sciatica as opposed to bone mets for example so at this time not ocncerned but low threshold for looking for mets if symptoms of the neck/low back worsen   Orders Placed This Encounter  Procedures   MR LUMBAR SPINE [place]   Ambulatory referral to Physical Therapy   Meds ordered this encounter  Medications   gabapentin (NEURONTIN) 300 MG capsule    Sig: Take 1 capsule (300 mg total) by mouth 3 (three) times daily as needed. May take 600mg  at bedtime if needed.    Dispense:  90 capsule    Refill:  11    Cc: Koirala, Dibas, MD,  Koirala, Dibas, MD  Sarina Ill, MD  [facility] [address] [place], [place] [address]  Phone [phone] Fax [phone]

## 2021-09-24 NOTE — Patient Instructions (Addendum)
MRI lumbar spine: lumbar stenosis with claudication, left leg weakness, ongoing >2 years, was told had stenosis and needed surgery need to check and requesting imaging from [place], worsening. Options include: PT (need new MRI first), medications(Gabapentin), Lidoderm patches (OTC), injections into the low back which would need to go back to [place] [facility] depending on results of MRI Lumbar spine. Gabapentin prn. Left leg flexion weakness, left AJ reduced, likely L5/s1 on the left which fits the distribution down the leg to the bottom of the feet..  Chronic neck pain, some occ radiculopathy, exam not worrisome for stenosis, will also add to PT, she denies anything sounding like occipital neuralgia, the pain goes down the arm and that's not really the issue she has a lot of neck tightness more between shoulder blades more myofascial cervical pain. Send to PT to help and dry needling and PT. Denies any pain at the occipital emergence but could be from c2-c4 degenerative changes (where the occipital nerve comes out) causing cervical myofascial pain. Reflexes normal, hoffman's negative, do not suspect cervical stenosis likely degenerative changes with cervical myofascial pain.   PT next door neuro PT DRY NEEDLING and PT MRI Lumbar Spine Gabapentin as needed  Gabapentin Capsules or Tablets What is this medication? GABAPENTIN (GA ba pen tin) treats nerve pain. It may also be used to prevent and control seizures in people with epilepsy. It works by calming overactive nerves in your body. This medicine may be used for other purposes; ask your health care provider or pharmacist if you have questions. COMMON BRAND NAME(S): Active-PAC with Gabapentin, Orpha Bur, Gralise, Neurontin What should I tell my care team before I take this medication? They need to know if you have any of these conditions: Alcohol or substance use disorder Kidney disease Lung or breathing disease Suicidal thoughts, plans, or  attempt; a previous suicide attempt by you or a family member An unusual or allergic reaction to gabapentin, other medications, foods, dyes, or preservatives Pregnant or trying to get pregnant Breast-feeding How should I use this medication? Take this medication by mouth with a glass of water. Follow the directions on the prescription label. You can take it with or without food. If it upsets your stomach, take it with food. Take your medication at regular intervals. Do not take it more often than directed. Do not stop taking except on your care team's advice. If you are directed to break the 600 or 800 mg tablets in half as part of your dose, the extra half tablet should be used for the next dose. If you have not used the extra half tablet within 28 days, it should be thrown away. A special MedGuide will be given to you by the pharmacist with each prescription and refill. Be sure to read this information carefully each time. Talk to your care team about the use of this medication in children. While this medication may be prescribed for children as young as 3 years for selected conditions, precautions do apply. Overdosage: If you think you have taken too much of this medicine contact a poison control center or emergency room at once. NOTE: This medicine is only for you. Do not share this medicine with others. What if I miss a dose? If you miss a dose, take it as soon as you can. If it is almost time for your next dose, take only that dose. Do not take double or extra doses. What may interact with this medication? Alcohol Antihistamines for allergy, cough, and cold Certain  medications for anxiety or sleep Certain medications for depression like amitriptyline, fluoxetine, sertraline Certain medications for seizures like phenobarbital, primidone Certain medications for stomach problems General anesthetics like halothane, isoflurane, methoxyflurane, propofol Local anesthetics like lidocaine,  pramoxine, tetracaine Medications that relax muscles for surgery Opioid medications for pain Phenothiazines like chlorpromazine, mesoridazine, prochlorperazine, thioridazine This list may not describe all possible interactions. Give your health care provider a list of all the medicines, herbs, non-prescription drugs, or dietary supplements you use. Also tell them if you smoke, drink alcohol, or use illegal drugs. Some items may interact with your medicine. What should I watch for while using this medication? Visit your care team for regular checks on your progress. You may want to keep a record at home of how you feel your condition is responding to treatment. You may want to share this information with your care team at each visit. You should contact your care team if your seizures get worse or if you have any new types of seizures. Do not stop taking this medication or any of your seizure medications unless instructed by your care team. Stopping your medication suddenly can increase your seizures or their severity. This medication may cause serious skin reactions. They can happen weeks to months after starting the medication. Contact your care team right away if you notice fevers or flu-like symptoms with a rash. The rash may be red or purple and then turn into blisters or peeling of the skin. Or, you might notice a red rash with swelling of the face, lips or lymph nodes in your neck or under your arms. Wear a medical identification bracelet or chain if you are taking this medication for seizures. Carry a card that lists all your medications. This medication may affect your coordination, reaction time, or judgment. Do not drive or operate machinery until you know how this medication affects you. Sit up or stand slowly to reduce the risk of dizzy or fainting spells. Drinking alcohol with this medication can increase the risk of these side effects. Your mouth may get dry. Chewing sugarless gum or sucking  hard candy, and drinking plenty of water may help. Watch for new or worsening thoughts of suicide or depression. This includes sudden changes in mood, behaviors, or thoughts. These changes can happen at any time but are more common in the beginning of treatment or after a change in dose. Call your care team right away if you experience these thoughts or worsening depression. If you become pregnant while using this medication, you may enroll in the [place] Pregnancy Registry by calling [id number]. This registry collects information about the safety of antiepileptic medication use during pregnancy. What side effects may I notice from receiving this medication? Side effects that you should report to your care team as soon as possible: Allergic reactions or angioedema--skin rash, itching, hives, swelling of the face, eyes, lips, tongue, arms, or legs, trouble swallowing or breathing Rash, fever, and swollen lymph nodes Thoughts of suicide or self harm, worsening mood, feelings of depression Trouble breathing Unusual changes in mood or behavior in children after use such as difficulty concentrating, hostility, or restlessness Side effects that usually do not require medical attention (report to your care team if they continue or are bothersome): Dizziness Drowsiness Nausea Swelling of ankles, feet, or hands Vomiting This list may not describe all possible side effects. Call your doctor for medical advice about side effects. You may report side effects to FDA at 1-800-FDA-1088. Where should I  keep my medication? Keep out of reach of children and pets. Store at room temperature between 15 and 30 degrees C (59 and 86 degrees F). Get rid of any unused medication after the expiration date. This medication may cause accidental overdose and death if taken by other adults, children, or pets. To get rid of medications that are no longer needed or have expired: Take the  medication to a medication take-back program. Check with your pharmacy or law enforcement to find a location. If you cannot return the medication, check the label or package insert to see if the medication should be thrown out in the garbage or flushed down the toilet. If you are not sure, ask your care team. If it is safe to put it in the trash, empty the medication out of the container. Mix the medication with cat litter, dirt, coffee grounds, or other unwanted substance. Seal the mixture in a bag or container. Put it in the trash. NOTE: This sheet is a summary. It may not cover all possible information. If you have questions about this medicine, talk to your doctor, pharmacist, or health care provider.  2023 Elsevier/Gold Standard (2020-01-17 00:00:00)

## 2021-09-24 NOTE — Telephone Encounter (Signed)
Pt states she will call back to schedule her 3-4 month video appt.

## 2021-09-26 NOTE — Progress Notes (Signed)
[address]       [place] [address]             [phone]                    Jaimi L Traore [place] Medical Record #[id number] Date of Birth: [birth year]  Referring: Garner Nash, DO Primary Care: Lujean Amel, MD Primary Cardiologist: None  Chief Complaint:    Chief Complaint  Patient presents with   Lung Lesion    Surgical consult, CTA Chest 09/03/21/PET Scan 09/12/21/ Bronch 09/10/21/ PFT's 09/12/21    History of Present Illness:    Anna Roth 61 y.o. 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  She does have a history of smoking, and quit 2 to 3 weeks ago.  The nodule was originally found incidentally on cross-sectional imaging when she presented for evaluation of a pulmonary embolism.     Zubrod Score: At the time of surgery this patient's most appropriate activity status/level should be described as: [x]     0    Normal activity, no symptoms []     1    Restricted in physical strenuous activity but ambulatory, able to do out light work []     2    Ambulatory and capable of self care, unable to do work activities, up and about               >50 % of waking hours                              []     3    Only limited self care, in bed greater than 50% of waking hours []     4    Completely disabled, no self care, confined to bed or chair []     5    Moribund   Past Medical History:  Diagnosis Date   COPD (chronic obstructive pulmonary disease) ([place])    Deep vein thrombophlebitis of leg ([place])    H/O: Bell's palsy    History of Anemia    during pregnancy   Hypertension    Lung cancer ([place]) 2023    Past Surgical History:  Procedure Laterality Date   BLADDER SUSPENSION     BRONCHIAL BIOPSY  09/10/2021   Procedure: BRONCHIAL BIOPSIES;  Surgeon: Garner Nash, DO;  Location: [facility];  Service: Pulmonary;;   BRONCHIAL NEEDLE ASPIRATION BIOPSY  09/10/2021   Procedure:  BRONCHIAL NEEDLE ASPIRATION BIOPSIES;  Surgeon: Garner Nash, DO;  Location: [facility];  Service: Pulmonary;;   CARPAL TUNNEL RELEASE Bilateral    FINE NEEDLE ASPIRATION  09/10/2021   Procedure: FINE NEEDLE ASPIRATION;  Surgeon: Garner Nash, DO;  Location: [facility];  Service: Pulmonary;;   neck fusion     x4   VIDEO BRONCHOSCOPY WITH ENDOBRONCHIAL ULTRASOUND Bilateral 09/10/2021   Procedure: VIDEO BRONCHOSCOPY WITH ENDOBRONCHIAL ULTRASOUND;  Surgeon: Garner Nash, DO;  Location: [facility];  Service: Pulmonary;  Laterality: Bilateral;    Family History  Problem Relation Age of Onset   Diabetes Mother    Stroke Sister    Cancer Paternal Aunt        stomach   Cancer Maternal Grandmother        stomach   Cancer Paternal Grandmother        stomach  Social History   Tobacco Use  Smoking Status Every Day   Packs/day: 0.50   Types: Cigarettes  Smokeless Tobacco Never  Tobacco Comments   Smokes 4 packs of cigarettes a week. 09/05/2021 [place]    Social History   Substance and Sexual Activity  Alcohol Use Yes   Alcohol/week: 14.0 standard drinks of alcohol   Types: 7 Glasses of wine, 7 Shots of liquor per week   Comment: glass of wine and cocktail each night     No Known Allergies  Current Outpatient Medications  Medication Sig Dispense Refill   clobetasol ointment (TEMOVATE) 6.31 % Apply 1 Application topically 2 (two) times daily as needed (rash/irritation.).     Dupilumab (DUPIXENT) 300 MG/2ML SOPN Inject 300 mg into the skin every 14 (fourteen) days.     gabapentin (NEURONTIN) 300 MG capsule Take 1 capsule (300 mg total) by mouth 3 (three) times daily as needed. May take 600mg  at bedtime if needed. 90 capsule 11   ibuprofen (ADVIL) 200 MG tablet Take 200 mg by mouth every 8 (eight) hours as needed (pain.).     No current facility-administered medications for this visit.    Review of Systems  Constitutional:  Negative for malaise/fatigue and weight  loss.  Respiratory:  Negative for cough and shortness of breath.   Cardiovascular:  Negative for chest pain.  Neurological: Negative.   Psychiatric/Behavioral:  The patient is nervous/anxious.      PHYSICAL EXAMINATION: BP (!) 150/77   Pulse 96   Resp 20   Ht 5\' 7"  (1.702 m)   Wt 195 lb (88.5 kg)   SpO2 95% Comment: RA  BMI 30.54 kg/m  Physical Exam Constitutional:      General: She is not in acute distress.    Appearance: Normal appearance. She is normal weight. She is not ill-appearing.  Eyes:     Extraocular Movements: Extraocular movements intact.  Cardiovascular:     Rate and Rhythm: Normal rate.  Pulmonary:     Effort: Pulmonary effort is normal. No respiratory distress.  Abdominal:     General: Abdomen is flat. There is no distension.  Musculoskeletal:        General: Normal range of motion.     Cervical back: Normal range of motion.  Skin:    General: Skin is warm and dry.  Neurological:     General: No focal deficit present.     Mental Status: She is alert and oriented to person, place, and time.     Diagnostic Studies & Laboratory data:     Recent Radiology Findings:   NM PET Image Initial (PI) Skull Base To Thigh (F-18 FDG)  Result Date: 09/14/2021 CLINICAL DATA:  Initial treatment strategy for pulmonary nodule. EXAM: NUCLEAR MEDICINE PET SKULL BASE TO THIGH TECHNIQUE: 9.6 mCi F-18 FDG was injected intravenously. Full-ring PET imaging was performed from the skull base to thigh after the radiotracer. CT data was obtained and used for attenuation correction and anatomic localization. Fasting blood glucose: 97 mg/dl COMPARISON:  Chest CT 09/03/2021 FINDINGS: Mediastinal blood pool activity: SUV max 3.0 Liver activity: SUV max NA NECK: No hypermetabolic lymph nodes in the neck. Incidental CT findings: None. CHEST: Right upper lobe pulmonary nodule of concern on recent CTA chest is markedly hypermetabolic with SUV max = 9.9. No definite hypermetabolic right hilar  lymphadenopathy. 7 mm short axis precarinal node on 54/4 shows low level FDG uptake with SUV max = 3.8. Uptake in the right hilum is SUV max =  10.9 without a discrete hypermetabolic lymph node evident on noncontrast CT imaging. 12 mm subcutaneous nodule anterior left shoulder (56/2) is hypermetabolic with SUV max = 9.3. Incidental CT findings: Mild atherosclerotic calcification is noted in the wall of the thoracic aorta. ABDOMEN/PELVIS: No abnormal hypermetabolic activity within the liver, pancreas, adrenal glands, or spleen. No hypermetabolic lymph nodes in the abdomen or pelvis. Incidental CT findings: There is moderate atherosclerotic calcification of the abdominal aorta without aneurysm. SKELETON: No focal hypermetabolic activity to suggest skeletal metastasis. Sclerosis along the right anterior SI joint is likely degenerative. No hypermetabolism. Incidental CT findings: None. IMPRESSION: 1. Right upper lobe nodule of concern on recent CTA chest is markedly hypermetabolic consistent with primary bronchogenic neoplasm. Low level hypermetabolism seen in the right hilum and associated with a small precarinal lymph node, indeterminate but close attention on follow-up recommended as metastatic disease not excluded. 2. 12 mm superficial subcutaneous nodule anterior left shoulder. This may be infectious/inflammatory and should be amenable to clinical inspection. 3. No unexpected or suspicious hypermetabolism in the neck, abdomen, or pelvis. 4.  Aortic Atherosclerois (ICD10-170.0) Electronically Signed   By: Misty Stanley M.D.   On: 09/14/2021 08:36   DG Chest Port 1 View  Result Date: 09/10/2021 CLINICAL DATA:  S/p bronchoscopy.  [id number] EXAM: PORTABLE CHEST 1 VIEW COMPARISON:  Left x-ray 04/11/2019 FINDINGS: The heart and mediastinal contours are unchanged. Interval development of right peripheral airspace opacity. No pulmonary edema. No pleural effusion. No pneumothorax. No acute osseous abnormality. IMPRESSION:  Interval development of right peripheral airspace opacity. Followup PA and lateral chest X-ray is recommended in 3-4 weeks following therapy to ensure resolution and exclude underlying malignancy. Electronically Signed   By: Iven Finn M.D.   On: 09/10/2021 15:46   DG C-ARM BRONCHOSCOPY  Result Date: 09/10/2021 C-ARM BRONCHOSCOPY: Fluoroscopy was utilized by the requesting physician.  No radiographic interpretation.   CT Angio Chest PE W and/or Wo Contrast  Result Date: 09/03/2021 CLINICAL DATA:  Left leg pain and swelling a few days. Possible pulmonary embolism. EXAM: CT ANGIOGRAPHY CHEST WITH CONTRAST TECHNIQUE: Multidetector CT imaging of the chest was performed using the standard protocol during bolus administration of intravenous contrast. Multiplanar CT image reconstructions and MIPs were obtained to evaluate the vascular anatomy. RADIATION DOSE REDUCTION: This exam was performed according to the departmental dose-optimization program which includes automated exposure control, adjustment of the mA and/or kV according to patient size and/or use of iterative reconstruction technique. CONTRAST:  40mL OMNIPAQUE IOHEXOL 350 MG/ML SOLN COMPARISON:  None Available. FINDINGS: Cardiovascular: Heart is normal size. Thoracic aorta is normal in caliber. Pulmonary arterial system is well opacified without evidence of emboli. Remaining vascular structures are unremarkable. Mediastinum/Nodes: No evidence of mediastinal or hilar adenopathy. Remaining mediastinal structures are unremarkable. Lungs/Pleura: Lungs are adequately inflated. Minimal bibasilar atelectasis. No effusion. Irregular nodule over the anterolateral right upper lobe measuring 1.4 cm (1.1 x 1.7 cm). Airways are normal. Upper Abdomen: No acute findings. Minimal calcified plaque over the abdominal aorta. Musculoskeletal: No focal abnormality. Review of the MIP images confirms the above findings. IMPRESSION: 1. No acute cardiopulmonary disease and no  evidence of pulmonary embolism. 2. Irregular 1.4 cm nodule over the right upper lobe. Findings are concerning for primary bronchogenic carcinoma. Recommend referral to [facility]). Per Fleischner Society Guidelines, consider a non-contrast Chest CT at 3 months, a PET/CT, or tissue sampling. These guidelines do not apply to immunocompromised patients and patients with cancer. Follow up in patients with significant  comorbidities as clinically warranted. For lung cancer screening, adhere to Lung-RADS guidelines. Reference: Radiology. 2017; 284(1):228-43. 3. Aortic atherosclerosis. Aortic Atherosclerosis (ICD10-I70.0). Electronically Signed   By: Marin Olp M.D.   On: 09/03/2021 18:31   US Venous Img Lower Unilateral Left  Result Date: 09/03/2021 CLINICAL DATA:  Pain and swelling EXAM: Left LOWER EXTREMITY VENOUS DOPPLER ULTRASOUND TECHNIQUE: Gray-scale sonography with compression, as well as color and duplex ultrasound, were performed to evaluate the deep venous system(s) from the level of the common femoral vein through the popliteal and proximal calf veins. COMPARISON:  None Available. FINDINGS: VENOUS Normal compressibility of the common femoral, superficial femoral, and popliteal veins, as well as the visualized calf veins. Visualized portions of profunda femoral vein and great saphenous vein unremarkable. No filling defects to suggest DVT on grayscale or color Doppler imaging. Doppler waveforms show normal direction of venous flow, normal respiratory plasticity and response to augmentation. Limited views of the contralateral common femoral vein are unremarkable. OTHER None. Limitations: none IMPRESSION: There is no evidence of deep venous thrombosis in left lower extremity. Electronically Signed   By: Elmer Picker M.D.   On: 09/03/2021 16:08       I have independently reviewed the above radiology studies  and reviewed the findings with the patient.   Recent  Lab Findings: Lab Results  Component Value Date   WBC 10.9 (H) 09/03/2021   HGB 15.2 (H) 09/03/2021   HCT 45.1 09/03/2021   PLT 213 09/03/2021   GLUCOSE 85 09/03/2021   CHOL 354 (H) 05/27/2016   TRIG 225 (H) 05/27/2016   HDL 57 05/27/2016   LDLCALC 252 (H) 05/27/2016   ALT 19 05/27/2016   AST 17 05/27/2016   NA 140 09/03/2021   K 4.0 09/03/2021   CL 102 09/03/2021   CREATININE 0.74 09/03/2021   BUN 14 09/03/2021   CO2 25 09/03/2021   TSH 2.030 05/27/2016     PFTs:  - FVC: 61% - FEV1: 61% -DLCO: 85%     Assessment / Plan:   62 year old female with a 1.4 cm biopsy-proven right upper lobe squamous cell cancer.  Pulmonary function testing are acceptable.  There was some scant activity in the hilum, and pretracheal space but biopsy were negative.  We discussed the risks and benefits of a right robotic assisted thoracoscopy with right upper lobectomy.  She is agreeable to proceed.  She will require stress test prior to surgery.     I  spent 40 minutes with  the patient face to face in counseling and coordination of care.    Lajuana Matte 09/27/2021 2:51 PM

## 2021-09-26 NOTE — H&P (View-Only) (Signed)
[address]       [place] [address]             [phone]                    Karletta L [facility]berg [facility] Medical Record #[id number] Date of Birth: [birth year]  Referring: Garner Nash, DO Primary Care: Lujean Amel, MD Primary Cardiologist: None  Chief Complaint:    Chief Complaint  Patient presents with   Lung Lesion    Surgical consult, CTA Chest 09/03/21/PET Scan 09/12/21/ Bronch 09/10/21/ PFT's 09/12/21    History of Present Illness:    Anna Roth 61 y.o. 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  She does have a history of smoking, and quit 2 to 3 weeks ago.  The nodule was originally found incidentally on cross-sectional imaging when she presented for evaluation of a pulmonary embolism.     Zubrod Score: At the time of surgery this patient's most appropriate activity status/level should be described as: [x]     0    Normal activity, no symptoms []     1    Restricted in physical strenuous activity but ambulatory, able to do out light work []     2    Ambulatory and capable of self care, unable to do work activities, up and about               >50 % of waking hours                              []     3    Only limited self care, in bed greater than 50% of waking hours []     4    Completely disabled, no self care, confined to bed or chair []     5    Moribund   Past Medical History:  Diagnosis Date   COPD (chronic obstructive pulmonary disease) ([place])    Deep vein thrombophlebitis of leg ([place])    H/O: Bell's palsy    History of Anemia    during pregnancy   Hypertension    Lung cancer ([place]) 2023    Past Surgical History:  Procedure Laterality Date   BLADDER SUSPENSION     BRONCHIAL BIOPSY  09/10/2021   Procedure: BRONCHIAL BIOPSIES;  Surgeon: Garner Nash, DO;  Location: [facility];  Service: Pulmonary;;   BRONCHIAL NEEDLE ASPIRATION BIOPSY  09/10/2021   Procedure:  BRONCHIAL NEEDLE ASPIRATION BIOPSIES;  Surgeon: Garner Nash, DO;  Location: [facility];  Service: Pulmonary;;   CARPAL TUNNEL RELEASE Bilateral    FINE NEEDLE ASPIRATION  09/10/2021   Procedure: FINE NEEDLE ASPIRATION;  Surgeon: Garner Nash, DO;  Location: [facility];  Service: Pulmonary;;   neck fusion     x4   VIDEO BRONCHOSCOPY WITH ENDOBRONCHIAL ULTRASOUND Bilateral 09/10/2021   Procedure: VIDEO BRONCHOSCOPY WITH ENDOBRONCHIAL ULTRASOUND;  Surgeon: Garner Nash, DO;  Location: [facility];  Service: Pulmonary;  Laterality: Bilateral;    Family History  Problem Relation Age of Onset   Diabetes Mother    Stroke Sister    Cancer Paternal Aunt        stomach   Cancer Maternal Grandmother        stomach   Cancer Paternal Grandmother        stomach  Social History   Tobacco Use  Smoking Status Every Day   Packs/day: 0.50   Types: Cigarettes  Smokeless Tobacco Never  Tobacco Comments   Smokes 4 packs of cigarettes a week. 09/05/2021 [place]    Social History   Substance and Sexual Activity  Alcohol Use Yes   Alcohol/week: 14.0 standard drinks of alcohol   Types: 7 Glasses of wine, 7 Shots of liquor per week   Comment: glass of wine and cocktail each night     No Known Allergies  Current Outpatient Medications  Medication Sig Dispense Refill   clobetasol ointment (TEMOVATE) 0.10 % Apply 1 Application topically 2 (two) times daily as needed (rash/irritation.).     Dupilumab (DUPIXENT) 300 MG/2ML SOPN Inject 300 mg into the skin every 14 (fourteen) days.     gabapentin (NEURONTIN) 300 MG capsule Take 1 capsule (300 mg total) by mouth 3 (three) times daily as needed. May take 600mg  at bedtime if needed. 90 capsule 11   ibuprofen (ADVIL) 200 MG tablet Take 200 mg by mouth every 8 (eight) hours as needed (pain.).     No current facility-administered medications for this visit.    Review of Systems  Constitutional:  Negative for malaise/fatigue and weight  loss.  Respiratory:  Negative for cough and shortness of breath.   Cardiovascular:  Negative for chest pain.  Neurological: Negative.   Psychiatric/Behavioral:  The patient is nervous/anxious.      PHYSICAL EXAMINATION: BP (!) 150/77   Pulse 96   Resp 20   Ht 5\' 7"  (1.702 m)   Wt 195 lb (88.5 kg)   SpO2 95% Comment: RA  BMI 30.54 kg/m  Physical Exam Constitutional:      General: She is not in acute distress.    Appearance: Normal appearance. She is normal weight. She is not ill-appearing.  Eyes:     Extraocular Movements: Extraocular movements intact.  Cardiovascular:     Rate and Rhythm: Normal rate.  Pulmonary:     Effort: Pulmonary effort is normal. No respiratory distress.  Abdominal:     General: Abdomen is flat. There is no distension.  Musculoskeletal:        General: Normal range of motion.     Cervical back: Normal range of motion.  Skin:    General: Skin is warm and dry.  Neurological:     General: No focal deficit present.     Mental Status: She is alert and oriented to person, place, and time.     Diagnostic Studies & Laboratory data:     Recent Radiology Findings:   NM PET Image Initial (PI) Skull Base To Thigh (F-18 FDG)  Result Date: 09/14/2021 CLINICAL DATA:  Initial treatment strategy for pulmonary nodule. EXAM: NUCLEAR MEDICINE PET SKULL BASE TO THIGH TECHNIQUE: 9.6 mCi F-18 FDG was injected intravenously. Full-ring PET imaging was performed from the skull base to thigh after the radiotracer. CT data was obtained and used for attenuation correction and anatomic localization. Fasting blood glucose: 97 mg/dl COMPARISON:  Chest CT 09/03/2021 FINDINGS: Mediastinal blood pool activity: SUV max 3.0 Liver activity: SUV max NA NECK: No hypermetabolic lymph nodes in the neck. Incidental CT findings: None. CHEST: Right upper lobe pulmonary nodule of concern on recent CTA chest is markedly hypermetabolic with SUV max = 9.9. No definite hypermetabolic right hilar  lymphadenopathy. 7 mm short axis precarinal node on 54/4 shows low level FDG uptake with SUV max = 3.8. Uptake in the right hilum is SUV max =  10.9 without a discrete hypermetabolic lymph node evident on noncontrast CT imaging. 12 mm subcutaneous nodule anterior left shoulder (96/0) is hypermetabolic with SUV max = 9.3. Incidental CT findings: Mild atherosclerotic calcification is noted in the wall of the thoracic aorta. ABDOMEN/PELVIS: No abnormal hypermetabolic activity within the liver, pancreas, adrenal glands, or spleen. No hypermetabolic lymph nodes in the abdomen or pelvis. Incidental CT findings: There is moderate atherosclerotic calcification of the abdominal aorta without aneurysm. SKELETON: No focal hypermetabolic activity to suggest skeletal metastasis. Sclerosis along the right anterior SI joint is likely degenerative. No hypermetabolism. Incidental CT findings: None. IMPRESSION: 1. Right upper lobe nodule of concern on recent CTA chest is markedly hypermetabolic consistent with primary bronchogenic neoplasm. Low level hypermetabolism seen in the right hilum and associated with a small precarinal lymph node, indeterminate but close attention on follow-up recommended as metastatic disease not excluded. 2. 12 mm superficial subcutaneous nodule anterior left shoulder. This may be infectious/inflammatory and should be amenable to clinical inspection. 3. No unexpected or suspicious hypermetabolism in the neck, abdomen, or pelvis. 4.  Aortic Atherosclerois (ICD10-170.0) Electronically Signed   By: Misty Stanley M.D.   On: 09/14/2021 08:36   DG Chest Port 1 View  Result Date: 09/10/2021 CLINICAL DATA:  S/p bronchoscopy.  [id number] EXAM: PORTABLE CHEST 1 VIEW COMPARISON:  Left x-ray 04/11/2019 FINDINGS: The heart and mediastinal contours are unchanged. Interval development of right peripheral airspace opacity. No pulmonary edema. No pleural effusion. No pneumothorax. No acute osseous abnormality. IMPRESSION:  Interval development of right peripheral airspace opacity. Followup PA and lateral chest X-ray is recommended in 3-4 weeks following therapy to ensure resolution and exclude underlying malignancy. Electronically Signed   By: Iven Finn M.D.   On: 09/10/2021 15:46   DG C-ARM BRONCHOSCOPY  Result Date: 09/10/2021 C-ARM BRONCHOSCOPY: Fluoroscopy was utilized by the requesting physician.  No radiographic interpretation.   CT Angio Chest PE W and/or Wo Contrast  Result Date: 09/03/2021 CLINICAL DATA:  Left leg pain and swelling a few days. Possible pulmonary embolism. EXAM: CT ANGIOGRAPHY CHEST WITH CONTRAST TECHNIQUE: Multidetector CT imaging of the chest was performed using the standard protocol during bolus administration of intravenous contrast. Multiplanar CT image reconstructions and MIPs were obtained to evaluate the vascular anatomy. RADIATION DOSE REDUCTION: This exam was performed according to the departmental dose-optimization program which includes automated exposure control, adjustment of the mA and/or kV according to patient size and/or use of iterative reconstruction technique. CONTRAST:  34mL OMNIPAQUE IOHEXOL 350 MG/ML SOLN COMPARISON:  None Available. FINDINGS: Cardiovascular: Heart is normal size. Thoracic aorta is normal in caliber. Pulmonary arterial system is well opacified without evidence of emboli. Remaining vascular structures are unremarkable. Mediastinum/Nodes: No evidence of mediastinal or hilar adenopathy. Remaining mediastinal structures are unremarkable. Lungs/Pleura: Lungs are adequately inflated. Minimal bibasilar atelectasis. No effusion. Irregular nodule over the anterolateral right upper lobe measuring 1.4 cm (1.1 x 1.7 cm). Airways are normal. Upper Abdomen: No acute findings. Minimal calcified plaque over the abdominal aorta. Musculoskeletal: No focal abnormality. Review of the MIP images confirms the above findings. IMPRESSION: 1. No acute cardiopulmonary disease and no  evidence of pulmonary embolism. 2. Irregular 1.4 cm nodule over the right upper lobe. Findings are concerning for primary bronchogenic carcinoma. Recommend referral to [facility]). Per Fleischner Society Guidelines, consider a non-contrast Chest CT at 3 months, a PET/CT, or tissue sampling. These guidelines do not apply to immunocompromised patients and patients with cancer. Follow up in patients with significant  comorbidities as clinically warranted. For lung cancer screening, adhere to Lung-RADS guidelines. Reference: Radiology. 2017; 284(1):228-43. 3. Aortic atherosclerosis. Aortic Atherosclerosis (ICD10-I70.0). Electronically Signed   By: Marin Olp M.D.   On: 09/03/2021 18:31   US Venous Img Lower Unilateral Left  Result Date: 09/03/2021 CLINICAL DATA:  Pain and swelling EXAM: Left LOWER EXTREMITY VENOUS DOPPLER ULTRASOUND TECHNIQUE: Gray-scale sonography with compression, as well as color and duplex ultrasound, were performed to evaluate the deep venous system(s) from the level of the common femoral vein through the popliteal and proximal calf veins. COMPARISON:  None Available. FINDINGS: VENOUS Normal compressibility of the common femoral, superficial femoral, and popliteal veins, as well as the visualized calf veins. Visualized portions of profunda femoral vein and great saphenous vein unremarkable. No filling defects to suggest DVT on grayscale or color Doppler imaging. Doppler waveforms show normal direction of venous flow, normal respiratory plasticity and response to augmentation. Limited views of the contralateral common femoral vein are unremarkable. OTHER None. Limitations: none IMPRESSION: There is no evidence of deep venous thrombosis in left lower extremity. Electronically Signed   By: Elmer Picker M.D.   On: 09/03/2021 16:08       I have independently reviewed the above radiology studies  and reviewed the findings with the patient.   Recent  Lab Findings: Lab Results  Component Value Date   WBC 10.9 (H) 09/03/2021   HGB 15.2 (H) 09/03/2021   HCT 45.1 09/03/2021   PLT 213 09/03/2021   GLUCOSE 85 09/03/2021   CHOL 354 (H) 05/27/2016   TRIG 225 (H) 05/27/2016   HDL 57 05/27/2016   LDLCALC 252 (H) 05/27/2016   ALT 19 05/27/2016   AST 17 05/27/2016   NA 140 09/03/2021   K 4.0 09/03/2021   CL 102 09/03/2021   CREATININE 0.74 09/03/2021   BUN 14 09/03/2021   CO2 25 09/03/2021   TSH 2.030 05/27/2016     PFTs:  - FVC: 61% - FEV1: 61% -DLCO: 85%     Assessment / Plan:   61 year old female with a 1.4 cm biopsy-proven right upper lobe squamous cell cancer.  Pulmonary function testing are acceptable.  There was some scant activity in the hilum, and pretracheal space but biopsy were negative.  We discussed the risks and benefits of a right robotic assisted thoracoscopy with right upper lobectomy.  She is agreeable to proceed.  She will require stress test prior to surgery.     I  spent 40 minutes with  the patient face to face in counseling and coordination of care.    Lajuana Matte 09/27/2021 2:51 PM

## 2021-09-27 ENCOUNTER — Telehealth: Payer: Self-pay | Admitting: Neurology

## 2021-09-27 ENCOUNTER — Other Ambulatory Visit: Payer: Self-pay | Admitting: Thoracic Surgery (Cardiothoracic Vascular Surgery)

## 2021-09-27 ENCOUNTER — Institutional Professional Consult (permissible substitution) (INDEPENDENT_AMBULATORY_CARE_PROVIDER_SITE_OTHER): Payer: 59 | Admitting: Thoracic Surgery (Cardiothoracic Vascular Surgery)

## 2021-09-27 ENCOUNTER — Other Ambulatory Visit: Payer: Self-pay | Admitting: *Deleted

## 2021-09-27 VITALS — BP 150/77 | HR 96 | Resp 20 | Ht 67.0 in | Wt 195.0 lb

## 2021-09-27 DIAGNOSIS — M47816 Spondylosis without myelopathy or radiculopathy, lumbar region: Secondary | ICD-10-CM | POA: Insufficient documentation

## 2021-09-27 DIAGNOSIS — R918 Other nonspecific abnormal finding of lung field: Secondary | ICD-10-CM

## 2021-09-27 DIAGNOSIS — S129XXA Fracture of neck, unspecified, initial encounter: Secondary | ICD-10-CM | POA: Insufficient documentation

## 2021-09-27 DIAGNOSIS — M545 Low back pain, unspecified: Secondary | ICD-10-CM | POA: Insufficient documentation

## 2021-09-27 DIAGNOSIS — Z981 Arthrodesis status: Secondary | ICD-10-CM | POA: Insufficient documentation

## 2021-09-27 NOTE — Telephone Encounter (Signed)
[facility] Case Number: [id number] sent to GI

## 2021-10-01 ENCOUNTER — Ambulatory Visit (HOSPITAL_BASED_OUTPATIENT_CLINIC_OR_DEPARTMENT_OTHER): Payer: 59

## 2021-10-01 ENCOUNTER — Other Ambulatory Visit: Payer: Self-pay

## 2021-10-01 ENCOUNTER — Ambulatory Visit (INDEPENDENT_AMBULATORY_CARE_PROVIDER_SITE_OTHER): Payer: 59 | Admitting: Acute Care

## 2021-10-01 ENCOUNTER — Encounter (HOSPITAL_COMMUNITY): Payer: Self-pay | Admitting: Thoracic Surgery (Cardiothoracic Vascular Surgery)

## 2021-10-01 ENCOUNTER — Encounter: Payer: Self-pay | Admitting: Acute Care

## 2021-10-01 VITALS — BP 132/80 | HR 85 | Temp 98.4°F | Ht 67.0 in | Wt 194.6 lb

## 2021-10-01 DIAGNOSIS — R918 Other nonspecific abnormal finding of lung field: Secondary | ICD-10-CM

## 2021-10-01 DIAGNOSIS — C3411 Malignant neoplasm of upper lobe, right bronchus or lung: Secondary | ICD-10-CM | POA: Diagnosis not present

## 2021-10-01 LAB — MYOCARDIAL PERFUSION IMAGING
LV dias vol: 71 mL (ref 46–106)
LV sys vol: 25 mL
Nuc Stress EF: 65 %
Peak HR: 93 {beats}/min
Rest HR: 66 {beats}/min
Rest Nuclear Isotope Dose: 10.2 mCi
SDS: 0
SRS: 0
SSS: 0
ST Depression (mm): 0 mm
Stress Nuclear Isotope Dose: 29.6 mCi
TID: 0.99

## 2021-10-01 MED ORDER — TECHNETIUM TC 99M TETROFOSMIN IV KIT
29.6000 | PACK | Freq: Once | INTRAVENOUS | Status: AC | PRN
  Administered 2021-10-01: 29.6 via INTRAVENOUS

## 2021-10-01 MED ORDER — TECHNETIUM TC 99M TETROFOSMIN IV KIT
10.2000 | PACK | Freq: Once | INTRAVENOUS | Status: AC | PRN
  Administered 2021-10-01: 10.2 via INTRAVENOUS

## 2021-10-01 MED ORDER — REGADENOSON 0.4 MG/5ML IV SOLN
0.4000 mg | Freq: Once | INTRAVENOUS | Status: AC
  Administered 2021-10-01: 0.4 mg via INTRAVENOUS

## 2021-10-01 NOTE — Progress Notes (Signed)
Anna Roth denies chest pain or shortness of breath.  Patient denies having any s/s of Covid in her household, also denies any known exposure to Covid.  Mr Rotunno 's PCP is Dr Lauretta Grill Dorthy Cooler. I asked Anna Roth if she can come to the hospital to pick up a bottle of CHG and instructions, Patient said that she would.

## 2021-10-01 NOTE — Patient Instructions (Addendum)
It is good to see you today. We are glad you have recovered from your procedure so well.  Anna Roth with Surgery tomorrow.  Remain some free after surgery Call if you need [place] , we are here.  Use your rescue inhaler as needed for shortness of breath or wheezing. Please contact office for sooner follow up if symptoms do not improve or worsen or seek emergency care

## 2021-10-01 NOTE — Progress Notes (Signed)
History of Present Illness Anna Roth is a 61 y.o. female current every day smoker with new lung nodule noted on CTA Chest while in the ED on 09/03/2021 for leg pain. She is followed by Dr. Valeta Harms.    10/01/2021 Pt. Presents for follow up after bronchoscopy 09/10/2021. She had a Flexible video fiberoptic bronchoscopy with robotic assistance and biopsies on 09/10/2021 of a right upper lobe pulmonary nodule. 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 She was referred to Dr. Kipp Brood for evaluation for resection. PET showed some  scant activity in the hilum, and pretracheal space but biopsy were negative. She has a stress test today, and then plan is for a right robotic assisted thoracoscopy with right upper lobectomy if stress test is ok. PFT's have been reviewed and are acceptable. She is scheduled for surgery 10/02/2021.  She says she did have some scan blood in her secretions after coughing, this cleared after 1.5 days. She denied any sore throat or other pain other than the pain from coughing. She has quit smoking. She did this cold [place]. She is doing well from a pulmonary perspective. She is ready to undergo the surgical resection.   Test Results: Cytology 09/10/2021 FINAL MICROSCOPIC DIAGNOSIS:  A. LUNG, RUL NODULE, FINE NEEDLE ASPIRATION AND BIOPSY FORCEPS:  Malignant cells are present with features of squamous cell carcinoma.   The following immunostains stains are performed with appropriate  controls on the cellblock:  CK5/6: Positive in a small clump of neoplastic cells.  P63: Positive.  P40: Positive.   FINAL MICROSCOPIC DIAGNOSIS:  B. LYMPH NODE, 4R NODE, FINE NEEDLE ASPIRATION:  No malignant cells are identified.  Clusters of normal lymphocytes are present.    PET scan 09/12/2021 Right upper lobe nodule of concern on recent CTA chest is markedly hypermetabolic consistent with primary bronchogenic neoplasm. Low level hypermetabolism seen in the right hilum  and associated with a small precarinal lymph node, indeterminate but close attention on follow-up recommended as metastatic disease not excluded. 2. 12 mm superficial subcutaneous nodule anterior left shoulder. This may be infectious/inflammatory and should be amenable to clinical inspection. 3. No unexpected or suspicious hypermetabolism in the neck, abdomen, or pelvis. 4.  Aortic Atherosclerois (ICD10-170.0)  09/03/2021 CTA Chest No acute cardiopulmonary disease and no evidence of pulmonary embolism. 2. Irregular 1.4 cm nodule over the right upper lobe. Findings are concerning for primary bronchogenic carcinoma. Recommend referral to [facility]). Per Fleischner Society Guidelines, consider a non-contrast Chest CT at 3 months, a PET/CT, or tissue sampling.     Latest Ref Rng & Units 09/03/2021    4:35 PM 04/11/2019   10:46 AM 05/27/2016   10:26 AM  CBC  WBC 4.0 - 10.5 K/uL 10.9  10.8  8.9   Hemoglobin 12.0 - 15.0 g/dL 15.2  14.3  15.4   Hematocrit 36.0 - 46.0 % 45.1  44.1  46.7   Platelets 150 - 400 K/uL 213  219  248        Latest Ref Rng & Units 09/03/2021    4:35 PM 01/20/2020   11:25 AM 04/11/2019   10:46 AM  BMP  Glucose 70 - 99 mg/dL 85  81  97   BUN 8 - 23 mg/dL 14  12  11    Creatinine 0.44 - 1.00 mg/dL 0.74  0.63  0.68   Sodium 135 - 145 mmol/L 140  142  143   Potassium 3.5 - 5.1 mmol/L 4.0  4.2  3.8   Chloride 98 - 111 mmol/L 102  106  104   CO2 22 - 32 mmol/L 25  25  27    Calcium 8.9 - 10.3 mg/dL 10.0  9.4  9.6     BNP No results found for: "BNP"  ProBNP No results found for: "PROBNP"  PFT    Component Value Date/Time   FEV1PRE 1.72 09/12/2021 1553   FEV1POST 1.77 09/12/2021 1553   FVCPRE 2.25 09/12/2021 1553   FVCPOST 2.37 09/12/2021 1553   TLC 4.44 09/12/2021 1553   DLCOUNC 20.08 09/12/2021 1553   PREFEV1FVCRT 77 09/12/2021 1553   PSTFEV1FVCRT 75 09/12/2021 1553    NM PET Image Initial (PI) Skull Base To  Thigh (F-18 FDG)  Result Date: 09/14/2021 CLINICAL DATA:  Initial treatment strategy for pulmonary nodule. EXAM: NUCLEAR MEDICINE PET SKULL BASE TO THIGH TECHNIQUE: 9.6 mCi F-18 FDG was injected intravenously. Full-ring PET imaging was performed from the skull base to thigh after the radiotracer. CT data was obtained and used for attenuation correction and anatomic localization. Fasting blood glucose: 97 mg/dl COMPARISON:  Chest CT 09/03/2021 FINDINGS: Mediastinal blood pool activity: SUV max 3.0 Liver activity: SUV max NA NECK: No hypermetabolic lymph nodes in the neck. Incidental CT findings: None. CHEST: Right upper lobe pulmonary nodule of concern on recent CTA chest is markedly hypermetabolic with SUV max = 9.9. No definite hypermetabolic right hilar lymphadenopathy. 7 mm short axis precarinal node on 54/4 shows low level FDG uptake with SUV max = 3.8. Uptake in the right hilum is SUV max = 10.9 without a discrete hypermetabolic lymph node evident on noncontrast CT imaging. 12 mm subcutaneous nodule anterior left shoulder (77/4) is hypermetabolic with SUV max = 9.3. Incidental CT findings: Mild atherosclerotic calcification is noted in the wall of the thoracic aorta. ABDOMEN/PELVIS: No abnormal hypermetabolic activity within the liver, pancreas, adrenal glands, or spleen. No hypermetabolic lymph nodes in the abdomen or pelvis. Incidental CT findings: There is moderate atherosclerotic calcification of the abdominal aorta without aneurysm. SKELETON: No focal hypermetabolic activity to suggest skeletal metastasis. Sclerosis along the right anterior SI joint is likely degenerative. No hypermetabolism. Incidental CT findings: None. IMPRESSION: 1. Right upper lobe nodule of concern on recent CTA chest is markedly hypermetabolic consistent with primary bronchogenic neoplasm. Low level hypermetabolism seen in the right hilum and associated with a small precarinal lymph node, indeterminate but close attention on  follow-up recommended as metastatic disease not excluded. 2. 12 mm superficial subcutaneous nodule anterior left shoulder. This may be infectious/inflammatory and should be amenable to clinical inspection. 3. No unexpected or suspicious hypermetabolism in the neck, abdomen, or pelvis. 4.  Aortic Atherosclerois (ICD10-170.0) Electronically Signed   By: Misty Stanley M.D.   On: 09/14/2021 08:36   DG Chest Port 1 View  Result Date: 09/10/2021 CLINICAL DATA:  S/p bronchoscopy.  [id number] EXAM: PORTABLE CHEST 1 VIEW COMPARISON:  Left x-ray 04/11/2019 FINDINGS: The heart and mediastinal contours are unchanged. Interval development of right peripheral airspace opacity. No pulmonary edema. No pleural effusion. No pneumothorax. No acute osseous abnormality. IMPRESSION: Interval development of right peripheral airspace opacity. Followup PA and lateral chest X-ray is recommended in 3-4 weeks following therapy to ensure resolution and exclude underlying malignancy. Electronically Signed   By: Iven Finn M.D.   On: 09/10/2021 15:46   DG C-ARM BRONCHOSCOPY  Result Date: 09/10/2021 C-ARM BRONCHOSCOPY: Fluoroscopy was utilized by the requesting physician.  No radiographic interpretation.   CT Angio Chest PE W and/or Wo Contrast  Result Date: 09/03/2021 CLINICAL DATA:  Left leg pain and swelling a few days. Possible pulmonary embolism. EXAM: CT ANGIOGRAPHY CHEST WITH CONTRAST TECHNIQUE: Multidetector CT imaging of the chest was performed using the standard protocol during bolus administration of intravenous contrast. Multiplanar CT image reconstructions and MIPs were obtained to evaluate the vascular anatomy. RADIATION DOSE REDUCTION: This exam was performed according to the departmental dose-optimization program which includes automated exposure control, adjustment of the mA and/or kV according to patient size and/or use of iterative reconstruction technique. CONTRAST:  19mL OMNIPAQUE IOHEXOL 350 MG/ML SOLN  COMPARISON:  None Available. FINDINGS: Cardiovascular: Heart is normal size. Thoracic aorta is normal in caliber. Pulmonary arterial system is well opacified without evidence of emboli. Remaining vascular structures are unremarkable. Mediastinum/Nodes: No evidence of mediastinal or hilar adenopathy. Remaining mediastinal structures are unremarkable. Lungs/Pleura: Lungs are adequately inflated. Minimal bibasilar atelectasis. No effusion. Irregular nodule over the anterolateral right upper lobe measuring 1.4 cm (1.1 x 1.7 cm). Airways are normal. Upper Abdomen: No acute findings. Minimal calcified plaque over the abdominal aorta. Musculoskeletal: No focal abnormality. Review of the MIP images confirms the above findings. IMPRESSION: 1. No acute cardiopulmonary disease and no evidence of pulmonary embolism. 2. Irregular 1.4 cm nodule over the right upper lobe. Findings are concerning for primary bronchogenic carcinoma. Recommend referral to [facility]). Per Fleischner Society Guidelines, consider a non-contrast Chest CT at 3 months, a PET/CT, or tissue sampling. These guidelines do not apply to immunocompromised patients and patients with cancer. Follow up in patients with significant comorbidities as clinically warranted. For lung cancer screening, adhere to Lung-RADS guidelines. Reference: Radiology. 2017; 284(1):228-43. 3. Aortic atherosclerosis. Aortic Atherosclerosis (ICD10-I70.0). Electronically Signed   By: Marin Olp M.D.   On: 09/03/2021 18:31   US Venous Img Lower Unilateral Left  Result Date: 09/03/2021 CLINICAL DATA:  Pain and swelling EXAM: Left LOWER EXTREMITY VENOUS DOPPLER ULTRASOUND TECHNIQUE: Gray-scale sonography with compression, as well as color and duplex ultrasound, were performed to evaluate the deep venous system(s) from the level of the common femoral vein through the popliteal and proximal calf veins. COMPARISON:  None Available. FINDINGS: VENOUS  Normal compressibility of the common femoral, superficial femoral, and popliteal veins, as well as the visualized calf veins. Visualized portions of profunda femoral vein and great saphenous vein unremarkable. No filling defects to suggest DVT on grayscale or color Doppler imaging. Doppler waveforms show normal direction of venous flow, normal respiratory plasticity and response to augmentation. Limited views of the contralateral common femoral vein are unremarkable. OTHER None. Limitations: none IMPRESSION: There is no evidence of deep venous thrombosis in left lower extremity. Electronically Signed   By: Elmer Picker M.D.   On: 09/03/2021 16:08     Past medical hx Past Medical History:  Diagnosis Date   COPD (chronic obstructive pulmonary disease) ([place])    Deep vein thrombophlebitis of leg ([facility])    H/O: Bell's palsy    History of Anemia    during pregnancy   Hypertension    Lung cancer ([place]) 2023     Social History   Tobacco Use   Smoking status: Former    Packs/day: 0.50    Years: 42.00    Total pack years: 21.00    Types: Cigarettes    Start date: 51    Quit date: 09/26/2021    Years since quitting: 0.0   Smokeless tobacco: Never  Vaping Use   Vaping Use: Never used  Substance Use Topics  Alcohol use: Yes    Alcohol/week: 14.0 standard drinks of alcohol    Types: 7 Glasses of wine, 7 Shots of liquor per week    Comment: glass of wine and cocktail each night   Drug use: Never    Former smoker with a 42 pack year smoking history.  Tobacco Cessation: Quit 08/2021, 42 + pack year smoking history   Past surgical hx, Family hx, Social hx all reviewed.  Current Outpatient Medications on File Prior to Visit  Medication Sig   clobetasol ointment (TEMOVATE) 4.16 % Apply 1 Application topically 2 (two) times daily as needed (rash/irritation.).   Dupilumab (DUPIXENT) 300 MG/2ML SOPN Inject 300 mg into the skin every 14 (fourteen) days.   gabapentin (NEURONTIN) 300 MG  capsule Take 1 capsule (300 mg total) by mouth 3 (three) times daily as needed. May take 600mg  at bedtime if needed.   ibuprofen (ADVIL) 200 MG tablet Take 200 mg by mouth every 8 (eight) hours as needed (pain.).   No current facility-administered medications on file prior to visit.     No Known Allergies  Review Of Systems:  Constitutional:   No  weight loss, night sweats,  Fevers, chills, fatigue, or  lassitude.  HEENT:   No headaches,  Difficulty swallowing,  Tooth/dental problems, or  Sore throat,                No sneezing, itching, ear ache, nasal congestion, post nasal drip,   CV:  No chest pain,  Orthopnea, PND, swelling in lower extremities, anasarca, dizziness, palpitations, syncope.   GI  No heartburn, indigestion, abdominal pain, nausea, vomiting, diarrhea, change in bowel habits, loss of appetite, bloody stools.   Resp: No shortness of breath with exertion or at rest.  No excess mucus, no productive cough,  No non-productive cough,  No coughing up of blood.  No change in color of mucus.  No wheezing.  No chest wall deformity  Skin: no rash or lesions.  GU: no dysuria, change in color of urine, no urgency or frequency.  No flank pain, no hematuria   MS:  No joint pain or swelling.  No decreased range of motion.  No back pain.  Psych:  No change in mood or affect. No depression or anxiety.  No memory loss.   Vital Signs BP 132/80 (BP Location: Left Arm, Patient Position: Sitting, Cuff Size: Normal)   Pulse 85   Temp 98.4 F (36.9 C) (Oral)   Ht 5\' 7"  (1.702 m)   Wt 194 lb 9.6 oz (88.3 kg)   SpO2 96% Comment: RA  BMI 30.48 kg/m    Physical Exam:  General- No distress,  A&Ox3, pleasant ENT: No sinus tenderness, TM clear, pale nasal mucosa, no oral exudate,no post nasal drip, no LAN Cardiac: S1, S2, regular rate and rhythm, no murmur Chest: No wheeze/ rales/ dullness; no accessory muscle use, no nasal flaring, no sternal retractions Abd.: Soft Non-tender, ND,  BS +, Body mass index is 30.48 kg/m.  Ext: No clubbing cyanosis, edema, no obvious deformities Neuro:  normal strength, MAE x 4, A&O x 3, appropriate Skin: No rashes, warm and dry, no lesions  Psych: normal mood and behavior   Assessment/Plan  New Diagnosis Squamous Cell carcinoma RUL Former smoker quit 08/2021 Plan Stress test today 9/5 Surgery tomorrow 10/02/2021 per Dr. Kipp Brood if stress test is ok.  Emmit Alexanders with Surgery tomorrow. Remain smoke free after surgery  Call if you need [place] , we are here.  Use your rescue inhaler as needed for shortness of breath or wheezing. Please contact office for sooner follow up if symptoms do not improve or worsen or seek emergency care    I spent 40 minutes dedicated to the care of this patient on the date of this encounter to include pre-visit review of records, face-to-face time with the patient discussing conditions above, post visit ordering of testing, clinical documentation with the electronic health record, making appropriate referrals as documented, and communicating necessary information to the patient's healthcare team.    Magdalen Spatz, NP 10/01/2021  9:46 AM

## 2021-10-02 ENCOUNTER — Inpatient Hospital Stay (HOSPITAL_COMMUNITY): Payer: 59 | Admitting: Anesthesiology

## 2021-10-02 ENCOUNTER — Inpatient Hospital Stay (HOSPITAL_COMMUNITY): Payer: 59

## 2021-10-02 ENCOUNTER — Encounter (HOSPITAL_COMMUNITY)
Admission: RE | Disposition: A | Discharge status: Home / Self Care | Payer: Self-pay | Source: Home / Self Care | Attending: Thoracic Surgery (Cardiothoracic Vascular Surgery)

## 2021-10-02 ENCOUNTER — Encounter (HOSPITAL_COMMUNITY): Payer: Self-pay | Admitting: Thoracic Surgery (Cardiothoracic Vascular Surgery)

## 2021-10-02 ENCOUNTER — Other Ambulatory Visit: Payer: Self-pay

## 2021-10-02 ENCOUNTER — Inpatient Hospital Stay (HOSPITAL_COMMUNITY)
Admission: RE | Admit: 2021-10-02 | Discharge: 2021-10-03 | DRG: 164 | Disposition: A | Discharge status: Home / Self Care | Payer: 59 | Attending: Thoracic Surgery (Cardiothoracic Vascular Surgery) | Admitting: Thoracic Surgery (Cardiothoracic Vascular Surgery)

## 2021-10-02 ENCOUNTER — Telehealth: Payer: Self-pay | Admitting: Physical Therapy

## 2021-10-02 DIAGNOSIS — Z8672 Personal history of thrombophlebitis: Secondary | ICD-10-CM

## 2021-10-02 DIAGNOSIS — R918 Other nonspecific abnormal finding of lung field: Secondary | ICD-10-CM

## 2021-10-02 DIAGNOSIS — J449 Chronic obstructive pulmonary disease, unspecified: Secondary | ICD-10-CM | POA: Diagnosis present

## 2021-10-02 DIAGNOSIS — Z981 Arthrodesis status: Secondary | ICD-10-CM | POA: Diagnosis not present

## 2021-10-02 DIAGNOSIS — C3490 Malignant neoplasm of unspecified part of unspecified bronchus or lung: Secondary | ICD-10-CM | POA: Diagnosis present

## 2021-10-02 DIAGNOSIS — I10 Essential (primary) hypertension: Secondary | ICD-10-CM | POA: Diagnosis present

## 2021-10-02 DIAGNOSIS — C3411 Malignant neoplasm of upper lobe, right bronchus or lung: Secondary | ICD-10-CM | POA: Diagnosis present

## 2021-10-02 DIAGNOSIS — Z809 Family history of malignant neoplasm, unspecified: Secondary | ICD-10-CM | POA: Diagnosis not present

## 2021-10-02 DIAGNOSIS — Z902 Acquired absence of lung [part of]: Principal | ICD-10-CM

## 2021-10-02 DIAGNOSIS — Z20822 Contact with and (suspected) exposure to covid-19: Secondary | ICD-10-CM | POA: Diagnosis present

## 2021-10-02 DIAGNOSIS — E7800 Pure hypercholesterolemia, unspecified: Secondary | ICD-10-CM | POA: Diagnosis present

## 2021-10-02 DIAGNOSIS — Z87891 Personal history of nicotine dependence: Secondary | ICD-10-CM

## 2021-10-02 DIAGNOSIS — J9382 Other air leak: Secondary | ICD-10-CM | POA: Diagnosis not present

## 2021-10-02 HISTORY — DX: Pneumonia, unspecified organism: J18.9

## 2021-10-02 HISTORY — PX: INTERCOSTAL NERVE BLOCK: SHX5021

## 2021-10-02 HISTORY — PX: NODE DISSECTION: SHX5269

## 2021-10-02 HISTORY — DX: Unspecified osteoarthritis, unspecified site: M19.90

## 2021-10-02 LAB — ABO/RH: ABO/RH(D): B POS

## 2021-10-02 LAB — PROTIME-INR
INR: 1 (ref 0.8–1.2)
Prothrombin Time: 13.4 seconds (ref 11.4–15.2)

## 2021-10-02 LAB — URINALYSIS, ROUTINE W REFLEX MICROSCOPIC
Bacteria, UA: NONE SEEN
Bilirubin Urine: NEGATIVE
Glucose, UA: NEGATIVE mg/dL
Ketones, ur: NEGATIVE mg/dL
Leukocytes,Ua: NEGATIVE
Nitrite: NEGATIVE
Protein, ur: NEGATIVE mg/dL
Specific Gravity, Urine: 1.015 (ref 1.005–1.030)
pH: 5 (ref 5.0–8.0)

## 2021-10-02 LAB — COMPREHENSIVE METABOLIC PANEL
ALT: 23 U/L (ref 0–44)
AST: 25 U/L (ref 15–41)
Albumin: 4.1 g/dL (ref 3.5–5.0)
Alkaline Phosphatase: 76 U/L (ref 38–126)
Anion gap: 9 (ref 5–15)
BUN: 12 mg/dL (ref 8–23)
CO2: 24 mmol/L (ref 22–32)
Calcium: 9.7 mg/dL (ref 8.9–10.3)
Chloride: 106 mmol/L (ref 98–111)
Creatinine, Ser: 0.69 mg/dL (ref 0.44–1.00)
GFR, Estimated: >60 mL/min (ref >60)
Glucose, Bld: 96 mg/dL (ref 70–99)
Potassium: 4.3 mmol/L (ref 3.5–5.1)
Sodium: 139 mmol/L (ref 135–145)
Total Bilirubin: 0.8 mg/dL (ref 0.3–1.2)
Total Protein: 7.5 g/dL (ref 6.5–8.1)

## 2021-10-02 LAB — POCT I-STAT 7, (LYTES, BLD GAS, ICA,H+H)
Acid-Base Excess: 3 mmol/L — ABNORMAL HIGH (ref 0.0–2.0)
Bicarbonate: 29.3 mmol/L — ABNORMAL HIGH (ref 20.0–28.0)
Calcium, Ion: 1.26 mmol/L (ref 1.15–1.40)
HCT: 39 % (ref 36.0–46.0)
Hemoglobin: 13.3 g/dL (ref 12.0–15.0)
O2 Saturation: 100 %
Potassium: 3.8 mmol/L (ref 3.5–5.1)
Sodium: 136 mmol/L (ref 135–145)
TCO2: 31 mmol/L (ref 22–32)
pCO2 arterial: 53.5 mmHg — ABNORMAL HIGH (ref 32–48)
pH, Arterial: 7.347 — ABNORMAL LOW (ref 7.35–7.45)
pO2, Arterial: 620 mmHg — ABNORMAL HIGH (ref 83–108)

## 2021-10-02 LAB — CBC
HCT: 42.3 % (ref 36.0–46.0)
Hemoglobin: 14.1 g/dL (ref 12.0–15.0)
MCH: 28.4 pg (ref 26.0–34.0)
MCHC: 33.3 g/dL (ref 30.0–36.0)
MCV: 85.3 fL (ref 80.0–100.0)
Platelets: 219 10*3/uL (ref 150–400)
RBC: 4.96 MIL/uL (ref 3.87–5.11)
RDW: 14.4 % (ref 11.5–15.5)
WBC: 7.6 10*3/uL (ref 4.0–10.5)
nRBC: 0 % (ref 0.0–0.2)

## 2021-10-02 LAB — SURGICAL PCR SCREEN
MRSA, PCR: NEGATIVE
Staphylococcus aureus: NEGATIVE

## 2021-10-02 LAB — PREPARE RBC (CROSSMATCH)

## 2021-10-02 LAB — SARS CORONAVIRUS 2 BY RT PCR: SARS Coronavirus 2 by RT PCR: NEGATIVE

## 2021-10-02 LAB — APTT: aPTT: 25 seconds (ref 24–36)

## 2021-10-02 SURGERY — LOBECTOMY, LUNG, ROBOT-ASSISTED, USING VATS
Anesthesia: General | Site: Chest | Laterality: Right

## 2021-10-02 MED ORDER — ALBUTEROL SULFATE (2.5 MG/3ML) 0.083% IN NEBU
2.5000 mg | INHALATION_SOLUTION | RESPIRATORY_TRACT | Status: DC
  Administered 2021-10-02: 2.5 mg via RESPIRATORY_TRACT
  Filled 2021-10-02: qty 3

## 2021-10-02 MED ORDER — GABAPENTIN 300 MG PO CAPS
300.0000 mg | ORAL_CAPSULE | Freq: Three times a day (TID) | ORAL | Status: DC
  Administered 2021-10-02 – 2021-10-03 (×2): 300 mg via ORAL
  Filled 2021-10-02 (×2): qty 1

## 2021-10-02 MED ORDER — ONDANSETRON HCL 4 MG/2ML IJ SOLN
INTRAMUSCULAR | Status: DC | PRN
  Administered 2021-10-02: 4 mg via INTRAVENOUS

## 2021-10-02 MED ORDER — BISACODYL 5 MG PO TBEC
10.0000 mg | DELAYED_RELEASE_TABLET | Freq: Every day | ORAL | Status: DC
  Administered 2021-10-02 – 2021-10-03 (×2): 10 mg via ORAL
  Filled 2021-10-02 (×2): qty 2

## 2021-10-02 MED ORDER — FENTANYL CITRATE (PF) 250 MCG/5ML IJ SOLN
INTRAMUSCULAR | Status: AC
  Filled 2021-10-02: qty 5

## 2021-10-02 MED ORDER — ROCURONIUM BROMIDE 10 MG/ML (PF) SYRINGE
PREFILLED_SYRINGE | INTRAVENOUS | Status: AC
Start: 2021-10-02 — End: ?
  Filled 2021-10-02: qty 20

## 2021-10-02 MED ORDER — PROPOFOL 10 MG/ML IV BOLUS
INTRAVENOUS | Status: DC | PRN
  Administered 2021-10-02: 40 mg via INTRAVENOUS
  Administered 2021-10-02: 160 mg via INTRAVENOUS

## 2021-10-02 MED ORDER — CEFAZOLIN SODIUM-DEXTROSE 2-4 GM/100ML-% IV SOLN
2.0000 g | INTRAVENOUS | Status: AC
  Administered 2021-10-02: 2 g via INTRAVENOUS
  Filled 2021-10-02: qty 100

## 2021-10-02 MED ORDER — CEFAZOLIN SODIUM-DEXTROSE 2-4 GM/100ML-% IV SOLN
2.0000 g | Freq: Three times a day (TID) | INTRAVENOUS | Status: AC
  Administered 2021-10-02 – 2021-10-03 (×2): 2 g via INTRAVENOUS
  Filled 2021-10-02 (×2): qty 100

## 2021-10-02 MED ORDER — LACTATED RINGERS IV SOLN: INTRAVENOUS | Status: DC

## 2021-10-02 MED ORDER — SENNOSIDES-DOCUSATE SODIUM 8.6-50 MG PO TABS
1.0000 | ORAL_TABLET | Freq: Every day | ORAL | Status: DC
  Administered 2021-10-02: 1 via ORAL
  Filled 2021-10-02: qty 1

## 2021-10-02 MED ORDER — CHLORHEXIDINE GLUCONATE 0.12 % MT SOLN
15.0000 mL | Freq: Once | OROMUCOSAL | Status: AC
Start: 2021-10-02 — End: 2021-10-02
  Administered 2021-10-02: 15 mL via OROMUCOSAL
  Filled 2021-10-02: qty 15

## 2021-10-02 MED ORDER — MIDAZOLAM HCL 2 MG/2ML IJ SOLN
INTRAMUSCULAR | Status: DC | PRN
  Administered 2021-10-02: 2 mg via INTRAVENOUS

## 2021-10-02 MED ORDER — FENTANYL CITRATE (PF) 250 MCG/5ML IJ SOLN
INTRAMUSCULAR | Status: DC | PRN
  Administered 2021-10-02 (×3): 50 ug via INTRAVENOUS
  Administered 2021-10-02: 150 ug via INTRAVENOUS

## 2021-10-02 MED ORDER — HYDROMORPHONE HCL 1 MG/ML IJ SOLN
INTRAMUSCULAR | Status: AC
  Filled 2021-10-02: qty 1

## 2021-10-02 MED ORDER — ONDANSETRON HCL 4 MG/2ML IJ SOLN
4.0000 mg | Freq: Four times a day (QID) | INTRAMUSCULAR | Status: DC | PRN
Start: 2021-10-02 — End: 2021-10-03

## 2021-10-02 MED ORDER — 0.9 % SODIUM CHLORIDE (POUR BTL) OPTIME
TOPICAL | Status: DC | PRN
  Administered 2021-10-02: 2000 mL

## 2021-10-02 MED ORDER — SODIUM CHLORIDE FLUSH 0.9 % IV SOLN
INTRAVENOUS | Status: DC | PRN
  Administered 2021-10-02: 100 mL

## 2021-10-02 MED ORDER — LIDOCAINE 2% (20 MG/ML) 5 ML SYRINGE
INTRAMUSCULAR | Status: AC
  Filled 2021-10-02: qty 5

## 2021-10-02 MED ORDER — MIDAZOLAM HCL 2 MG/2ML IJ SOLN
INTRAMUSCULAR | Status: AC
  Filled 2021-10-02: qty 2

## 2021-10-02 MED ORDER — LACTATED RINGERS IV SOLN: INTRAVENOUS | Status: DC | PRN

## 2021-10-02 MED ORDER — PANTOPRAZOLE SODIUM 40 MG PO TBEC
40.0000 mg | DELAYED_RELEASE_TABLET | Freq: Every day | ORAL | Status: DC
  Administered 2021-10-03: 40 mg via ORAL
  Filled 2021-10-02: qty 1

## 2021-10-02 MED ORDER — ENOXAPARIN SODIUM 40 MG/0.4ML IJ SOSY
40.0000 mg | PREFILLED_SYRINGE | Freq: Every day | INTRAMUSCULAR | Status: DC
  Administered 2021-10-03: 40 mg via SUBCUTANEOUS
  Filled 2021-10-02: qty 0.4

## 2021-10-02 MED ORDER — BUPIVACAINE HCL (PF) 0.5 % IJ SOLN
INTRAMUSCULAR | Status: AC
  Filled 2021-10-02: qty 30

## 2021-10-02 MED ORDER — HEMOSTATIC AGENTS (NO CHARGE) OPTIME
TOPICAL | Status: DC | PRN
  Administered 2021-10-02: 1 via TOPICAL

## 2021-10-02 MED ORDER — PHENYLEPHRINE HCL-NACL 20-0.9 MG/250ML-% IV SOLN
INTRAVENOUS | Status: DC | PRN
  Administered 2021-10-02: 20 ug/min via INTRAVENOUS

## 2021-10-02 MED ORDER — ALBUTEROL SULFATE (2.5 MG/3ML) 0.083% IN NEBU: 2.5000 mg | INHALATION_SOLUTION | RESPIRATORY_TRACT | Status: DC

## 2021-10-02 MED ORDER — TRAMADOL HCL 50 MG PO TABS
50.0000 mg | ORAL_TABLET | Freq: Four times a day (QID) | ORAL | Status: DC | PRN
  Administered 2021-10-02: 100 mg via ORAL
  Filled 2021-10-02: qty 2

## 2021-10-02 MED ORDER — BUPIVACAINE LIPOSOME 1.3 % IJ SUSP
INTRAMUSCULAR | Status: AC
  Filled 2021-10-02: qty 20

## 2021-10-02 MED ORDER — PHENYLEPHRINE 80 MCG/ML (10ML) SYRINGE FOR IV PUSH (FOR BLOOD PRESSURE SUPPORT)
PREFILLED_SYRINGE | INTRAVENOUS | Status: DC | PRN
  Administered 2021-10-02: 80 ug via INTRAVENOUS
  Administered 2021-10-02: 160 ug via INTRAVENOUS

## 2021-10-02 MED ORDER — ACETAMINOPHEN 160 MG/5ML PO SOLN: 1000.0000 mg | Freq: Four times a day (QID) | ORAL | Status: DC

## 2021-10-02 MED ORDER — HYDROMORPHONE HCL 1 MG/ML IJ SOLN: 0.2500 mg | INTRAMUSCULAR | Status: DC | PRN

## 2021-10-02 MED ORDER — ROCURONIUM BROMIDE 10 MG/ML (PF) SYRINGE
PREFILLED_SYRINGE | INTRAVENOUS | Status: DC | PRN
  Administered 2021-10-02: 50 mg via INTRAVENOUS
  Administered 2021-10-02: 30 mg via INTRAVENOUS
  Administered 2021-10-02: 50 mg via INTRAVENOUS

## 2021-10-02 MED ORDER — HYDROMORPHONE HCL 1 MG/ML IJ SOLN
0.2500 mg | INTRAMUSCULAR | Status: DC | PRN
  Administered 2021-10-02 (×2): 0.5 mg via INTRAVENOUS

## 2021-10-02 MED ORDER — SUGAMMADEX SODIUM 200 MG/2ML IV SOLN
INTRAVENOUS | Status: DC | PRN
  Administered 2021-10-02: 200 mg via INTRAVENOUS

## 2021-10-02 MED ORDER — ONDANSETRON HCL 4 MG/2ML IJ SOLN
INTRAMUSCULAR | Status: AC
  Filled 2021-10-02: qty 2

## 2021-10-02 MED ORDER — ACETAMINOPHEN 500 MG PO TABS
1000.0000 mg | ORAL_TABLET | Freq: Four times a day (QID) | ORAL | Status: DC
  Administered 2021-10-02 – 2021-10-03 (×4): 1000 mg via ORAL
  Filled 2021-10-02 (×4): qty 2

## 2021-10-02 MED ORDER — DEXAMETHASONE SODIUM PHOSPHATE 10 MG/ML IJ SOLN
INTRAMUSCULAR | Status: AC
Start: 2021-10-02 — End: ?
  Filled 2021-10-02: qty 1

## 2021-10-02 MED ORDER — LIDOCAINE 2% (20 MG/ML) 5 ML SYRINGE
INTRAMUSCULAR | Status: DC | PRN
  Administered 2021-10-02: 60 mg via INTRAVENOUS

## 2021-10-02 MED ORDER — ORAL CARE MOUTH RINSE
15.0000 mL | Freq: Once | OROMUCOSAL | Status: AC
Start: 2021-10-02 — End: 2021-10-02

## 2021-10-02 MED ORDER — KETOROLAC TROMETHAMINE 15 MG/ML IJ SOLN
15.0000 mg | Freq: Four times a day (QID) | INTRAMUSCULAR | Status: DC
  Administered 2021-10-02 – 2021-10-03 (×4): 15 mg via INTRAVENOUS
  Filled 2021-10-02 (×4): qty 1

## 2021-10-02 MED ORDER — DEXAMETHASONE SODIUM PHOSPHATE 10 MG/ML IJ SOLN
INTRAMUSCULAR | Status: DC | PRN
  Administered 2021-10-02: 10 mg via INTRAVENOUS

## 2021-10-02 MED ORDER — MORPHINE SULFATE (PF) 2 MG/ML IV SOLN
2.0000 mg | INTRAVENOUS | Status: DC | PRN
  Administered 2021-10-02: 2 mg via INTRAVENOUS
  Filled 2021-10-02: qty 1

## 2021-10-02 MED ORDER — PHENYLEPHRINE 80 MCG/ML (10ML) SYRINGE FOR IV PUSH (FOR BLOOD PRESSURE SUPPORT)
PREFILLED_SYRINGE | INTRAVENOUS | Status: AC
Start: 2021-10-02 — End: ?
  Filled 2021-10-02: qty 10

## 2021-10-02 SURGICAL SUPPLY — 103 items
BLADE CLIPPER SURG (BLADE) ×1 IMPLANT
BLADE SURG 11 STRL SS (BLADE) ×1 IMPLANT
CANISTER SUCT 3000ML PPV (MISCELLANEOUS) ×2 IMPLANT
CANNULA REDUC XI 12-8 STAPL (CANNULA) ×2
CANNULA REDUCER 12-8 DVNC XI (CANNULA) ×2 IMPLANT
CATH THORACIC 28FR (CATHETERS) IMPLANT
CHLORAPREP W/TINT 26 (MISCELLANEOUS) ×1 IMPLANT
CNTNR URN SCR LID CUP LEK RST (MISCELLANEOUS) ×5 IMPLANT
CONN ST 1/4X3/8  BEN (MISCELLANEOUS)
CONN ST 1/4X3/8 BEN (MISCELLANEOUS) IMPLANT
CONT SPEC 4OZ STRL OR WHT (MISCELLANEOUS) ×11
DEFOGGER SCOPE WARMER CLEARIFY (MISCELLANEOUS) ×1 IMPLANT
DERMABOND ADVANCED (GAUZE/BANDAGES/DRESSINGS) ×1
DERMABOND ADVANCED .7 DNX12 (GAUZE/BANDAGES/DRESSINGS) ×1 IMPLANT
DISSECTOR BLUNT TIP ENDO 5MM (MISCELLANEOUS) IMPLANT
DRAIN CHANNEL 28F RND 3/8 FF (WOUND CARE) IMPLANT
DRAPE ARM DVNC X/XI (DISPOSABLE) ×4 IMPLANT
DRAPE COLUMN DVNC XI (DISPOSABLE) ×1 IMPLANT
DRAPE CV SPLIT W-CLR ANES SCRN (DRAPES) ×1 IMPLANT
DRAPE DA VINCI XI ARM (DISPOSABLE) ×4
DRAPE DA VINCI XI COLUMN (DISPOSABLE) ×1
DRAPE ORTHO SPLIT 77X108 STRL (DRAPES) ×1
DRAPE SURG ORHT 6 SPLT 77X108 (DRAPES) ×1 IMPLANT
ELECT REM PT RETURN 9FT ADLT (ELECTROSURGICAL) ×1
ELECTRODE REM PT RTRN 9FT ADLT (ELECTROSURGICAL) ×1 IMPLANT
GAUZE KITTNER 4X5 RF (MISCELLANEOUS) IMPLANT
GAUZE KITTNER 4X8 (MISCELLANEOUS) ×1 IMPLANT
GAUZE SPONGE 4X4 12PLY STRL (GAUZE/BANDAGES/DRESSINGS) IMPLANT
GLOVE BIO SURGEON STRL SZ7.5 (GLOVE) ×2 IMPLANT
GLOVE SURG POLYISO LF SZ8 (GLOVE) ×1 IMPLANT
GOWN STRL REUS W/ TWL LRG LVL3 (GOWN DISPOSABLE) ×2 IMPLANT
GOWN STRL REUS W/ TWL XL LVL3 (GOWN DISPOSABLE) ×3 IMPLANT
GOWN STRL REUS W/TWL 2XL LVL3 (GOWN DISPOSABLE) ×1 IMPLANT
GOWN STRL REUS W/TWL LRG LVL3 (GOWN DISPOSABLE) ×2
GOWN STRL REUS W/TWL XL LVL3 (GOWN DISPOSABLE) ×3
HEMOSTAT SURGICEL 2X14 (HEMOSTASIS) ×3 IMPLANT
IRRIGATION STRYKERFLOW (MISCELLANEOUS) IMPLANT
IRRIGATOR STRYKERFLOW (MISCELLANEOUS)
KIT BASIN OR (CUSTOM PROCEDURE TRAY) ×1 IMPLANT
KIT TURNOVER KIT B (KITS) ×1 IMPLANT
NDL 22X1.5 STRL (OR ONLY) (MISCELLANEOUS) ×1 IMPLANT
NEEDLE 22X1.5 STRL (OR ONLY) (MISCELLANEOUS) ×1 IMPLANT
NS IRRIG 1000ML POUR BTL (IV SOLUTION) ×3 IMPLANT
PACK CHEST (CUSTOM PROCEDURE TRAY) ×1 IMPLANT
PAD ARMBOARD 7.5X6 YLW CONV (MISCELLANEOUS) ×5 IMPLANT
PORT ACCESS TROCAR AIRSEAL 12 (TROCAR) ×1 IMPLANT
PORT ACCESS TROCAR AIRSEAL 5M (TROCAR) ×1
POUCH ENDO CATCH II 15MM (MISCELLANEOUS) IMPLANT
RELOAD STAPLE 45 2.0 GRY DVNC (STAPLE) IMPLANT
RELOAD STAPLE 45 2.5 WHT DVNC (STAPLE) IMPLANT
RELOAD STAPLE 45 3.5 BLU DVNC (STAPLE) IMPLANT
RELOAD STAPLE 45 4.3 GRN DVNC (STAPLE) IMPLANT
RELOAD STAPLER 2.5X45 WHT DVNC (STAPLE) ×2 IMPLANT
RELOAD STAPLER 3.5X45 BLU DVNC (STAPLE) ×4 IMPLANT
RELOAD STAPLER 4.3X45 GRN DVNC (STAPLE) ×3 IMPLANT
SCISSORS LAP 5X35 DISP (ENDOMECHANICALS) IMPLANT
SEAL CANN UNIV 5-8 DVNC XI (MISCELLANEOUS) ×2 IMPLANT
SEAL XI 5MM-8MM UNIVERSAL (MISCELLANEOUS) ×2
SET TRI-LUMEN FLTR TB AIRSEAL (TUBING) ×1 IMPLANT
SOLUTION ELECTROLUBE (MISCELLANEOUS) IMPLANT
SPONGE TONSIL TAPE 1 RFD (DISPOSABLE) IMPLANT
STAPLE RELOAD 45 2.0 GRAY (STAPLE) ×1
STAPLE RELOAD 45 2.0 GRAY DVNC (STAPLE) ×1 IMPLANT
STAPLER 45 SUREFORM CVD (STAPLE) ×1
STAPLER 45 SUREFORM CVD DVNC (STAPLE) IMPLANT
STAPLER CANNULA SEAL DVNC XI (STAPLE) ×2 IMPLANT
STAPLER CANNULA SEAL XI (STAPLE) ×2
STAPLER RELOAD 2.5X45 WHITE (STAPLE) ×2
STAPLER RELOAD 2.5X45 WHT DVNC (STAPLE) ×2
STAPLER RELOAD 3.5X45 BLU DVNC (STAPLE) ×4
STAPLER RELOAD 3.5X45 BLUE (STAPLE) ×4
STAPLER RELOAD 4.3X45 GREEN (STAPLE) ×3
STAPLER RELOAD 4.3X45 GRN DVNC (STAPLE) ×3
STOPCOCK 4 WAY LG BORE MALE ST (IV SETS) ×1 IMPLANT
SUT MNCRL AB 3-0 PS2 18 (SUTURE) IMPLANT
SUT MON AB 2-0 CT1 36 (SUTURE) IMPLANT
SUT PDS AB 1 CTX 36 (SUTURE) IMPLANT
SUT PROLENE 4 0 RB 1 (SUTURE)
SUT PROLENE 4-0 RB1 .5 CRCL 36 (SUTURE) IMPLANT
SUT SILK  1 MH (SUTURE) ×1
SUT SILK 1 MH (SUTURE) ×1 IMPLANT
SUT SILK 1 TIES 10X30 (SUTURE) IMPLANT
SUT SILK 2 0 SH (SUTURE) IMPLANT
SUT SILK 2 0SH CR/8 30 (SUTURE) IMPLANT
SUT VIC AB 1 CTX 36 (SUTURE)
SUT VIC AB 1 CTX36XBRD ANBCTR (SUTURE) IMPLANT
SUT VIC AB 2-0 CT1 27 (SUTURE) ×1
SUT VIC AB 2-0 CT1 TAPERPNT 27 (SUTURE) ×1 IMPLANT
SUT VIC AB 3-0 SH 27 (SUTURE) ×2
SUT VIC AB 3-0 SH 27X BRD (SUTURE) ×2 IMPLANT
SUT VICRYL 0 TIES 12 18 (SUTURE) ×1 IMPLANT
SUT VICRYL 0 UR6 27IN ABS (SUTURE) ×2 IMPLANT
SUT VICRYL 2 TP 1 (SUTURE) IMPLANT
SYR 10ML LL (SYRINGE) ×1 IMPLANT
SYR 20ML LL LF (SYRINGE) ×1 IMPLANT
SYR 50ML LL SCALE MARK (SYRINGE) ×1 IMPLANT
SYSTEM SAHARA CHEST DRAIN ATS (WOUND CARE) ×1 IMPLANT
TAPE CLOTH 4X10 WHT NS (GAUZE/BANDAGES/DRESSINGS) ×1 IMPLANT
TAPE CLOTH SURG 4X10 WHT LF (GAUZE/BANDAGES/DRESSINGS) IMPLANT
TIP APPLICATOR SPRAY EXTEND 16 (VASCULAR PRODUCTS) IMPLANT
TOWEL GREEN STERILE (TOWEL DISPOSABLE) ×1 IMPLANT
TRAY FOLEY MTR SLVR 16FR STAT (SET/KITS/TRAYS/PACK) ×1 IMPLANT
TUBING EXTENTION W/L.L. (IV SETS) ×1 IMPLANT

## 2021-10-02 NOTE — Anesthesia Procedure Notes (Signed)
Procedure Name: Intubation Date/Time: 10/02/2021 12:52 PM  Performed by: Lorie Phenix, CRNAPre-anesthesia Checklist: Patient identified, Emergency Drugs available, Suction available and Patient being monitored Patient Re-evaluated:Patient Re-evaluated prior to induction Oxygen Delivery Method: Circle system utilized Preoxygenation: Pre-oxygenation with 100% oxygen Induction Type: IV induction Ventilation: Mask ventilation without difficulty Laryngoscope Size: Mac and 3 Grade View: Grade I Tube type: Oral Endobronchial tube: Left, Double lumen EBT, EBT position confirmed by fiberoptic bronchoscope and EBT position confirmed by auscultation and 37 Fr Number of attempts: 1 Airway Equipment and Method: Stylet Placement Confirmation: ETT inserted through vocal cords under direct vision, positive ETCO2 and breath sounds checked- equal and bilateral Tube secured with: Tape Dental Injury: Teeth and Oropharynx as per pre-operative assessment  Comments: Inserted by Everardo Pacific, SRNA under direct supervision of CRNA and MDA.

## 2021-10-02 NOTE — Transfer of Care (Signed)
Immediate Anesthesia Transfer of Care Note  Patient: Anna Roth  Procedure(s) Performed: XI ROBOTIC ASSISTED THORACOSCOPY-RIGHT UPPER LOBECTOMY (Right: Chest) INTERCOSTAL NERVE BLOCK (Right: Chest) NODE DISSECTION (Right: Chest)  Patient Location: PACU  Anesthesia Type:General  Level of Consciousness: drowsy  Airway & Oxygen Therapy: Patient Spontanous Breathing  Post-op Assessment: Report given to RN, Post -op Vital signs reviewed and stable and Patient moving all extremities X 4  Post vital signs: Reviewed and stable  Last Vitals:  Vitals Value Taken Time  BP 121/64 10/02/21 1600  Temp    Pulse 62 10/02/21 1602  Resp 14 10/02/21 1602  SpO2 97 % 10/02/21 1602  Vitals shown include unvalidated device data.  Last Pain:  Vitals:   10/02/21 0948  TempSrc:   PainSc: 0-No pain         Complications: No notable events documented.

## 2021-10-02 NOTE — Brief Op Note (Signed)
10/02/2021  3:40 PM  PATIENT:  Anna Roth  61 y.o. female  PRE-OPERATIVE DIAGNOSIS:  LUNG CANCER  POST-OPERATIVE DIAGNOSIS:  LUNG CANCER  PROCEDURE:  Procedure(s): XI ROBOTIC ASSISTED THORACOSCOPY-RIGHT UPPER LOBECTOMY (Right) INTERCOSTAL NERVE BLOCK (Right) NODE DISSECTION (Right)  SURGEON:  Surgeon(s) and Role:    * Lightfoot, Lucile Crater, MD - Primary  PHYSICIAN ASSISTANT: Jazier Mcglamery PA-C  ANESTHESIA:   general  EBL:  100 ML  BLOOD ADMINISTERED:none  DRAINS: (1 28 F ) Blake drain(s) in the RIGHT HEMITHORAX    LOCAL MEDICATIONS USED:  BUPIVICAINE  and OTHER EXPAREL  SPECIMEN:  Source of Specimen:  RIGHT UPPER LOBE AND LN SAMPLES  DISPOSITION OF SPECIMEN:  PATHOLOGY  COUNTS:  YES  TOURNIQUET:  * No tourniquets in log *  DICTATION: .Dragon Dictation  PLAN OF CARE: Admit to inpatient   PATIENT DISPOSITION:  STABLE TO PACU  Delay start of Pharmacological VTE agent (>24hrs) due to surgical blood loss or risk of bleeding: no  COMPLICATIONS: NO KNOWN

## 2021-10-02 NOTE — Telephone Encounter (Signed)
Following in-basket conversation w/ Dr. Jaynee Eagles, PT is electing to cancel pt eval for 10/04/2021 due to recent diagnosis and lobectomy procedure 10/02/2021.  LVM informing pt of this and for pt to follow up with Dr. Jaynee Eagles and PCP/oncology care team due to recent diagnosis and procedure to obtain new referral when appropriate to return to PT.  Instructed pt that front office would schedule new eval appt when new referral obtained.  Elease Etienne, PT, DPT

## 2021-10-02 NOTE — Anesthesia Procedure Notes (Signed)
Arterial Line Insertion Start/End9/06/2021 10:25 AM, 10/02/2021 10:35 AM Performed by: Dorann Lodge, CRNA, CRNA  Patient location: Pre-op. Preanesthetic checklist: patient identified, IV checked, site marked, risks and benefits discussed, surgical consent, monitors and equipment checked, pre-op evaluation, timeout performed and anesthesia consent Lidocaine 1% used for infiltration Left, radial was placed Catheter size: 20 G Hand hygiene performed  and maximum sterile barriers used   Attempts: 2 Procedure performed without using ultrasound guided technique. Following insertion, dressing applied and Biopatch. Post procedure assessment: normal and unchanged  Patient tolerated the procedure well with no immediate complications.

## 2021-10-02 NOTE — Op Note (Signed)
      [address]       [place] [address]             [phone]        10/02/2021  Patient:  Cornelius Moras Pre-Op Dx: Right upper lobe NSCLC   Post-op Dx:  same Procedure: - Robotic assisted right video thoracoscopy - right upper lobectomy - Mediastinal lymph node sampling - Intercostal nerve block  Surgeon and Role:      * Abdulrahim Siddiqi, Lucile Crater, MD - Primary  Assistant: Evonnie Pat, PA-C  An experienced assistant was required given the complexity of this surgery and the standard of surgical care. The assistant was needed for exposure, dissection, suctioning, retraction of delicate tissues and sutures, instrument exchange and for overall help during this procedure.    Anesthesia  general EBL:  100 ml Blood Administration: none Specimen:  Right upper lobe, hilar and mediastinal nodes  Drains: 28 F argyle chest tube in right chest Counts: correct   Indications: 61 year old female with a 1.4 cm biopsy-proven right upper lobe squamous cell cancer.  Pulmonary function testing are acceptable.  There was some scant activity in the hilum, and pretracheal space but biopsy were negative.  We discussed the risks and benefits of a right robotic assisted thoracoscopy with right upper lobectomy.  She is agreeable to proceed.  Findings: Normal anatomy,   Operative Technique: After the risks, benefits and alternatives were thoroughly discussed, the patient was brought to the operative theatre.  Anesthesia was induced, and the patient was then placed in a lateral decubitus position and was prepped and draped in normal sterile fashion.  An appropriate surgical pause was performed, and pre-operative antibiotics were dosed accordingly.  We began by placing our 4 robotic ports in the the 7th intercostal space targeting the hilum of the lung.  A 42mm assistant port was placed in the 9th intercostal space in the anterior axillary line.  The robot was then docked and all instruments  were passed under direct visualization.    The lung was then retracted superiorly, and the inferior pulmonary ligament was divided.  The hilum was mobilized anteriorly and posteriorly.  We identified the upper lobe pulmonary vein, and after careful isolation, it was divided with a vascular stapler.  We next moved to the pulmonary artery.  The artery was then divided with a vascular load stapler.  The bronchus to the upper lobe was then isolated.  After a test clamp, with good ventilation of the remaining lung, the bronchus was then divided.  The fissure was completed, and the specimen was passed into an endocatch bag.  It was removed from the anterior access site.    Lymph nodes were then sampled at hilum and mediastinum.  The chest was irrigated, and an air leak test was performed.  An intercostal nerve block was performed under direct visualization.  A 28 F chest tube was then placed, and we watch the remaining lobes re-expand.  The skin and soft tissue were closed with absorbable suture    The patient tolerated the procedure without any immediate complications, and was transferred to the PACU in stable condition.  Korver Graybeal Bary Leriche

## 2021-10-02 NOTE — Interval H&P Note (Signed)
History and Physical Interval Note:  10/02/2021 11:40 AM  Anna Roth  has presented today for surgery, with the diagnosis of LUNG CANCER.  The various methods of treatment have been discussed with the patient and family. After consideration of risks, benefits and other options for treatment, the patient has consented to  Procedure(s): XI ROBOTIC ASSISTED THORACOSCOPY-RIGHT UPPER LOBECTOMY (Right) as a surgical intervention.  The patient's history has been reviewed, patient examined, no change in status, stable for surgery.  I have reviewed the patient's chart and labs.  Questions were answered to the patient's satisfaction.     Tremar Wickens Bary Leriche

## 2021-10-02 NOTE — Anesthesia Preprocedure Evaluation (Signed)
Anesthesia Evaluation  Patient identified by MRN, date of birth, ID band Patient awake    Reviewed: Allergy & Precautions, NPO status , Patient's Chart, lab work & pertinent test results  Airway Mallampati: II  TM Distance: >3 FB     Dental   Pulmonary pneumonia, COPD, former smoker,    breath sounds clear to auscultation       Cardiovascular hypertension,  Rhythm:Regular Rate:Normal     Neuro/Psych    GI/Hepatic negative GI ROS, Neg liver ROS,   Endo/Other  negative endocrine ROS  Renal/GU negative Renal ROS     Musculoskeletal  (+) Arthritis ,   Abdominal   Peds  Hematology   Anesthesia Other Findings   Reproductive/Obstetrics                             Anesthesia Physical Anesthesia Plan  ASA: 3  Anesthesia Plan: General   Post-op Pain Management:    Induction: Intravenous  PONV Risk Score and Plan: 3 and Ondansetron, Midazolam and Dexamethasone  Airway Management Planned:   Additional Equipment: Arterial line  Intra-op Plan:   Post-operative Plan:   Informed Consent: I have reviewed the patients History and Physical, chart, labs and discussed the procedure including the risks, benefits and alternatives for the proposed anesthesia with the patient or authorized representative who has indicated his/her understanding and acceptance.     Dental advisory given  Plan Discussed with: CRNA and Anesthesiologist  Anesthesia Plan Comments:         Anesthesia Quick Evaluation

## 2021-10-02 NOTE — Hospital Course (Addendum)
Referring: Garner Nash, DO Primary Care: Lujean Amel, MD Primary Cardiologist: None   Chief Complaint:        Chief Complaint  Patient presents with   Lung Lesion      Surgical consult, CTA Chest 09/03/21/PET Scan 09/12/21/ Bronch 09/10/21/ PFT's 09/12/21      History of Present Illness:    Anna Roth 61 y.o. 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  She does have a history of smoking, and quit 2 to 3 weeks ago.  The nodule was originally found incidentally on cross-sectional imaging when she presented for evaluation of a pulmonary embolism.  Pulmonary function testing was acceptable to proceed and additionally she did undergo stress test prior to surgery which was unremarkable for cardiac ischemia and she was noted to have normal LV function.  Dr. Kipp Brood evaluated the patient relevant studies and recommended proceeding with robotic assisted right upper lobectomy.  Hospital course: The patient was admitted electively and on 10/02/2021 she was taken the operating room at which time she underwent robotic assisted right upper lobectomy with lymph node dissection.  She tolerated the procedure well was taken to the postanesthesia care unit in stable condition.  Postoperative hospital course:  The patient has progressed well.  On postoperative day 1 she remained afebrile with stable vital signs in sinus rhythm.  Oxygen is being weaned.  She has had good urine output and maintains normal renal function.  Her chest tube is to be removed on postop day 1.  She is noted not to have any anemia.  She is tolerating a course of routine rehab and pulmonary hygiene.  Final pathology is pending.

## 2021-10-02 NOTE — Plan of Care (Signed)

## 2021-10-03 ENCOUNTER — Inpatient Hospital Stay (HOSPITAL_COMMUNITY): Payer: 59

## 2021-10-03 ENCOUNTER — Encounter (HOSPITAL_COMMUNITY): Payer: Self-pay | Admitting: Thoracic Surgery (Cardiothoracic Vascular Surgery)

## 2021-10-03 LAB — CBC
HCT: 37 % (ref 36.0–46.0)
Hemoglobin: 12.6 g/dL (ref 12.0–15.0)
MCH: 29 pg (ref 26.0–34.0)
MCHC: 34.1 g/dL (ref 30.0–36.0)
MCV: 85.1 fL (ref 80.0–100.0)
Platelets: 183 10*3/uL (ref 150–400)
RBC: 4.35 MIL/uL (ref 3.87–5.11)
RDW: 14.3 % (ref 11.5–15.5)
WBC: 13.5 10*3/uL — ABNORMAL HIGH (ref 4.0–10.5)
nRBC: 0 % (ref 0.0–0.2)

## 2021-10-03 LAB — BASIC METABOLIC PANEL
Anion gap: 11 (ref 5–15)
BUN: 14 mg/dL (ref 8–23)
CO2: 23 mmol/L (ref 22–32)
Calcium: 9.1 mg/dL (ref 8.9–10.3)
Chloride: 101 mmol/L (ref 98–111)
Creatinine, Ser: 0.86 mg/dL (ref 0.44–1.00)
GFR, Estimated: >60 mL/min (ref >60)
Glucose, Bld: 136 mg/dL — ABNORMAL HIGH (ref 70–99)
Potassium: 3.9 mmol/L (ref 3.5–5.1)
Sodium: 135 mmol/L (ref 135–145)

## 2021-10-03 MED ORDER — ALBUTEROL SULFATE (2.5 MG/3ML) 0.083% IN NEBU: 2.5000 mg | INHALATION_SOLUTION | RESPIRATORY_TRACT | Status: DC | PRN

## 2021-10-03 MED ORDER — TRAMADOL HCL 50 MG PO TABS: 50.0000 mg | ORAL_TABLET | Freq: Four times a day (QID) | ORAL | 0 refills | Status: DC | PRN

## 2021-10-03 MED ORDER — ALBUTEROL SULFATE (2.5 MG/3ML) 0.083% IN NEBU
2.5000 mg | INHALATION_SOLUTION | Freq: Four times a day (QID) | RESPIRATORY_TRACT | Status: DC
  Administered 2021-10-03: 2.5 mg via RESPIRATORY_TRACT
  Filled 2021-10-03: qty 3

## 2021-10-03 NOTE — Progress Notes (Signed)
Mobility Specialist Progress Note    10/03/21 1111  Mobility  Activity Ambulated with assistance in hallway  Level of Assistance Standby assist, set-up cues, supervision of patient - no hands on  Assistive Device Front wheel walker  Distance Ambulated (ft) 350 ft  Activity Response Tolerated well  $Mobility charge 1 Mobility   Pre-Mobility: 71 HR, 89% SpO2 During Mobility: 93% SpO2 Post-Mobility: 75 HR, 95% SpO2  Pt received in bed and agreeable. No complaints on walk. Maintained SpO2 >/=90% on RA. Returned to sitting EOB with call bell in reach.    Hildred Alamin Mobility Specialist

## 2021-10-03 NOTE — Progress Notes (Signed)
Discharge instructions reviewed with pt and her husband.  Copy of instructions given to pt, informed pt of script sent to her pharmacy.   Wound care reviewed with pt and husband, dressings and tape given to change dressing.  At 75 Pt d/c'd via wheelchair with belongings, with husband.            Escorted by staff.

## 2021-10-03 NOTE — Anesthesia Postprocedure Evaluation (Signed)
Anesthesia Post Note  Patient: Anna Roth  Procedure(s) Performed: XI ROBOTIC ASSISTED THORACOSCOPY-RIGHT UPPER LOBECTOMY (Right: Chest) INTERCOSTAL NERVE BLOCK (Right: Chest) NODE DISSECTION (Right: Chest)     Patient location during evaluation: PACU Anesthesia Type: General Level of consciousness: awake and alert Pain management: pain level controlled Vital Signs Assessment: post-procedure vital signs reviewed and stable Respiratory status: spontaneous breathing, nonlabored ventilation, respiratory function stable and patient connected to nasal cannula oxygen Cardiovascular status: blood pressure returned to baseline and stable Postop Assessment: no apparent nausea or vomiting Anesthetic complications: no   No notable events documented.  Last Vitals:  Vitals:   10/02/21 2301 10/03/21 0300  BP: 133/60 (!) 123/55  Pulse: 77 70  Resp: 19 20  Temp: 36.7 C 36.5 C  SpO2:  95%    Last Pain:  Vitals:   10/03/21 0300  TempSrc: Oral  PainSc: 0-No pain                 Barnet Glasgow

## 2021-10-03 NOTE — Progress Notes (Addendum)
[address]       [place] [address]             [phone]      1 Day Post-Op Procedure(s) (LRB): XI ROBOTIC ASSISTED THORACOSCOPY-RIGHT UPPER LOBECTOMY (Right) INTERCOSTAL NERVE BLOCK (Right) NODE DISSECTION (Right) Subjective: Overall feels well, pain well controled  Objective: Vital signs in last 24 hours: Temp:  [97.7 F (36.5 C)-98.7 F (37.1 C)] 97.7 F (36.5 C) (09/07 0300) Pulse Rate:  [61-80] 70 (09/07 0300) Cardiac Rhythm: Normal sinus rhythm (09/06 1950) Resp:  [13-20] 20 (09/07 0300) BP: (121-161)/(55-95) 123/55 (09/07 0300) SpO2:  [92 %-97 %] 95 % (09/07 0300) Arterial Line BP: (138-172)/(55-77) 172/77 (09/06 1630) Weight:  [88.9 kg] 88.9 kg (09/06 0907)  Hemodynamic parameters for last 24 hours:    Intake/Output from previous day: 09/06 0701 - 09/07 0700 In: 1320 [P.O.:120; I.V.:1000; IV Piggyback:100] Out: 1095 [Urine:1000; Blood:25; Chest Tube:70] Intake/Output this shift: No intake/output data recorded.  General appearance: alert, cooperative, and no distress Heart: regular rate and rhythm Lungs: minor coarseness throughout, improves with cough Abdomen: benign Extremities: warm well perfused Wound: incis healing well  Lab Results: Recent Labs    10/02/21 1028 10/02/21 1306 10/03/21 0427  WBC 7.6  --  13.5*  HGB 14.1 13.3 12.6  HCT 42.3 39.0 37.0  PLT 219  --  183   BMET:  Recent Labs    10/02/21 1028 10/02/21 1306 10/03/21 0427  NA 139 136 135  K 4.3 3.8 3.9  CL 106  --  101  CO2 24  --  23  GLUCOSE 96  --  136*  BUN 12  --  14  CREATININE 0.69  --  0.86  CALCIUM 9.7  --  9.1    PT/INR:  Recent Labs    10/02/21 1028  LABPROT 13.4  INR 1.0   ABG    Component Value Date/Time   PHART 7.347 (L) 10/02/2021 1306   HCO3 29.3 (H) 10/02/2021 1306   TCO2 31 10/02/2021 1306   O2SAT 100 10/02/2021 1306   CBG (last 3)  No results for input(s): "GLUCAP" in the last 72 hours.  Meds Scheduled Meds:   acetaminophen  1,000 mg Oral Q6H   Or   acetaminophen (TYLENOL) oral liquid 160 mg/5 mL  1,000 mg Oral Q6H   albuterol  2.5 mg Nebulization QID   bisacodyl  10 mg Oral Daily   enoxaparin (LOVENOX) injection  40 mg Subcutaneous Daily   gabapentin  300 mg Oral TID   ketorolac  15 mg Intravenous Q6H   pantoprazole  40 mg Oral Daily   senna-docusate  1 tablet Oral QHS   Continuous Infusions: PRN Meds:.morphine injection, ondansetron (ZOFRAN) IV, traMADol  Xrays DG Chest Port 1 View  Result Date: 10/02/2021 CLINICAL DATA:  61 year old female status post right upper lobectomy. EXAM: PORTABLE CHEST - 1 VIEW COMPARISON:  None Available. FINDINGS: Nodular prominence of the right superior mediastinum in the region of surgical chain staples. Unchanged mild cardiomegaly. Interval insertion of right apical thoracostomy tube. No pneumothorax or pleural effusion. No acute osseous abnormality. Lower cervical fusion hardware in place, unchanged. IMPRESSION: Postsurgical changes after right upper lobectomy with nodular opacification about the superior right mediastinum. No pleural effusion or pneumothorax. Well-positioned right apical thoracostomy tube. Electronically Signed   By: Ruthann Cancer M.D.   On: 10/02/2021 16:38   DG Chest 2 View  Result Date: 10/02/2021 CLINICAL DATA:  Preop right lung  surgery EXAM: CHEST - 2 VIEW COMPARISON:  09/10/2021 FINDINGS: 19 mm right upper lobe spiculated pulmonary nodule. No focal consolidation. No pleural effusion or pneumothorax. Heart and mediastinal contours are unremarkable. No acute osseous abnormality. IMPRESSION: 1. No acute cardiopulmonary disease. 2. A 19 mm right upper lobe spiculated pulmonary nodule most concerning for malignancy until proven otherwise. Electronically Signed   By: Kathreen Devoid M.D.   On: 10/02/2021 09:50   Myocardial Perfusion Imaging  Result Date: 10/01/2021   The study is normal. The study is low risk.   No ST deviation was noted.   LV  perfusion is normal. There is no evidence of ischemia. There is no evidence of infarction.   Left ventricular function is normal. Nuclear stress EF: 65 %. The left ventricular ejection fraction is normal (55-65%). End diastolic cavity size is normal. End systolic cavity size is normal.   Prior study not available for comparison.    Assessment/Plan: S/P Procedure(s) (LRB): XI ROBOTIC ASSISTED THORACOSCOPY-RIGHT UPPER LOBECTOMY (Right) INTERCOSTAL NERVE BLOCK (Right) NODE DISSECTION (Right)  POD#1  1 afeb, s BP 120's-160's, sinus rhythm 2 sats good on 2 liters 3 good UOP- normal renal fxn 4 CT 70 cc, small air leak 5 CXR ? Small right apical space, good aeration 6 minor reactive leukocytosis, WBC 13.5 7 not anemic 8 encourage pulm hygiene/rehab, wean O2, depending on tube removal poss d/c later today v tomorrow      LOS: 1 day    Anna Roth 10/03/2021    Agree with above Will remove Cts Wean O2 Home today possibly  [name]

## 2021-10-04 ENCOUNTER — Ambulatory Visit: Payer: 59 | Admitting: Physical Therapy

## 2021-10-04 LAB — TYPE AND SCREEN
ABO/RH(D): B POS
Antibody Screen: NEGATIVE
Unit division: 0
Unit division: 0

## 2021-10-04 LAB — BPAM RBC
Blood Product Expiration Date: 202309212359
Blood Product Expiration Date: 202309222359
ISSUE DATE / TIME: 202309011506
Unit Type and Rh: 7300
Unit Type and Rh: 7300

## 2021-10-04 LAB — SURGICAL PATHOLOGY

## 2021-10-04 NOTE — Discharge Summary (Signed)
Physician Discharge Summary  Patient ID: Anna Roth MRN: [id number] DOB/AGE: May 14, 1960 61 y.o.  Admit date: 10/02/2021 Discharge date: 10/04/2021  Admission Diagnoses: Right upper lobe lung carcinoma  Discharge Diagnoses:  Principal Problem:   Lung cancer [facility]) Active Problems:   S/P lobectomy of lung  Patient Active Problem List   Diagnosis Date Noted   Lung cancer ([place]) 10/02/2021   S/P lobectomy of lung 10/02/2021   Arthropathy of lumbar facet joint 09/27/2021   Chronic low back pain 09/27/2021   Status post cervical spinal fusion 09/27/2021   Pseudoarthrosis of cervical spine ([place]) 09/27/2021   Cervical myofascial pain syndrome 09/24/2021   Lumbar back pain with radiculopathy affecting left lower extremity 09/24/2021   Lung mass 09/05/2021   Adenopathy 09/05/2021   Vitamin D deficiency 03/28/2021   Hypercholesterolemia 12/01/2019   Abdominal bloating 11/07/2019    Discharged Condition: good  Referring: Garner Nash, DO Primary Care: Lujean Amel, MD Primary Cardiologist: None   Chief Complaint:        Chief Complaint  Patient presents with   Lung Lesion      Surgical consult, CTA Chest 09/03/21/PET Scan 09/12/21/ Bronch 09/10/21/ PFT's 09/12/21      History of Present Illness:    Anna Roth 61 y.o. 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  She does have a history of smoking, and quit 2 to 3 weeks ago.  The nodule was originally found incidentally on cross-sectional imaging when she presented for evaluation of a pulmonary embolism.  Pulmonary function testing was acceptable to proceed and additionally she did undergo stress test prior to surgery which was unremarkable for cardiac ischemia and she was noted to have normal LV function.  Dr. Kipp Brood evaluated the patient relevant studies and recommended proceeding with robotic assisted right upper lobectomy.  Hospital course: The patient was  admitted electively and on 10/02/2021 she was taken the operating room at which time she underwent robotic assisted right upper lobectomy with lymph node dissection.  She tolerated the procedure well was taken to the postanesthesia care unit in stable condition.  Postoperative hospital course:  The patient has progressed well.  On postoperative day 1 she remained afebrile with stable vital signs in sinus rhythm.  Oxygen is being weaned.  She has had good urine output and maintains normal renal function.  Her chest tube is to be removed on postop day 1.  Chest x-ray following removal showed no evidence of pneumothorax.  She is noted not to have any anemia.  She is tolerating a course of routine rehab and pulmonary hygiene.  Oxygen was weaned and she maintains good saturations on room air.  Incisions are noted to be healing well.  Renal function is within normal limits.  Final pathology is pending.  Overall, at the time of discharge the patient was felt to be quite stable.   Consults: None  Significant Diagnostic Studies:  DG Chest 1 View  Result Date: 10/03/2021 CLINICAL DATA:  Chest tube removal. EXAM: CHEST  1 VIEW COMPARISON:  Earlier same day chest radiograph at 1236 hours and earlier FINDINGS: Interval removal of right chest tube. No pneumothorax. Postoperative changes at the right hilum are stable. Subsegmental atelectasis in the left lower lobe. No pleural effusion. Stable borderline enlargement of the cardiac silhouette. Partially visualized anterior cervical spinal fusion. IMPRESSION: Interval removal of right chest tube.  No pneumothorax. Stable postoperative appearance of the right hilum. Electronically  Signed   By: Ileana Roup M.D.   On: 10/03/2021 15:46   DG Chest Port 1 View  Result Date: 10/03/2021 CLINICAL DATA:  Status post partial resection of right lung EXAM: PORTABLE CHEST 1 VIEW COMPARISON:  Previous studies including the examination pneumonia earlier today FINDINGS: Transverse  diameter of heart is increased. There are no signs of pulmonary edema. Surgical staples are seen in medial right upper lung field. Prominence of right hilum and increased density in the medial right upper lung field have not changed significantly. Right chest tube is noted with its tip in the right apex. There is no pleural effusion or pneumothorax. Small linear density in left lower lung fields suggest subsegmental atelectasis. IMPRESSION: Postsurgical changes are noted in right lung. There are no new infiltrates or signs of alveolar pulmonary edema. Minimal subsegmental atelectasis is seen in left lower lung field. Electronically Signed   By: Elmer Picker M.D.   On: 10/03/2021 12:53   DG Chest Port 1 View  Result Date: 10/03/2021 CLINICAL DATA:  Lung cancer post surgery, chest tube EXAM: PORTABLE CHEST 1 VIEW COMPARISON:  Portable exam 0622 hours compared to 10/02/2021 FINDINGS: RIGHT thoracostomy tube unchanged. Borderline enlargement of cardiac silhouette. Mediastinal contours and pulmonary vascularity normal. Persistent RIGHT perihilar opacity likely related to surgery with associated surgical clips at site. No acute infiltrate, pleural effusion, or pneumothorax. Prior cervical spine fusion. IMPRESSION: Postoperative changes RIGHT hemithorax. Stable RIGHT thoracostomy tube without pneumothorax. Electronically Signed   By: Lavonia Dana M.D.   On: 10/03/2021 08:26   DG Chest Port 1 View  Result Date: 10/02/2021 CLINICAL DATA:  61 year old female status post right upper lobectomy. EXAM: PORTABLE CHEST - 1 VIEW COMPARISON:  None Available. FINDINGS: Nodular prominence of the right superior mediastinum in the region of surgical chain staples. Unchanged mild cardiomegaly. Interval insertion of right apical thoracostomy tube. No pneumothorax or pleural effusion. No acute osseous abnormality. Lower cervical fusion hardware in place, unchanged. IMPRESSION: Postsurgical changes after right upper lobectomy  with nodular opacification about the superior right mediastinum. No pleural effusion or pneumothorax. Well-positioned right apical thoracostomy tube. Electronically Signed   By: Ruthann Cancer M.D.   On: 10/02/2021 16:38   DG Chest 2 View  Result Date: 10/02/2021 CLINICAL DATA:  Preop right lung surgery EXAM: CHEST - 2 VIEW COMPARISON:  09/10/2021 FINDINGS: 19 mm right upper lobe spiculated pulmonary nodule. No focal consolidation. No pleural effusion or pneumothorax. Heart and mediastinal contours are unremarkable. No acute osseous abnormality. IMPRESSION: 1. No acute cardiopulmonary disease. 2. A 19 mm right upper lobe spiculated pulmonary nodule most concerning for malignancy until proven otherwise. Electronically Signed   By: Kathreen Devoid M.D.   On: 10/02/2021 09:50   Myocardial Perfusion Imaging  Result Date: 10/01/2021   The study is normal. The study is low risk.   No ST deviation was noted.   LV perfusion is normal. There is no evidence of ischemia. There is no evidence of infarction.   Left ventricular function is normal. Nuclear stress EF: 65 %. The left ventricular ejection fraction is normal (55-65%). End diastolic cavity size is normal. End systolic cavity size is normal.   Prior study not available for comparison.   NM PET Image Initial (PI) Skull Base To Thigh (F-18 FDG)  Result Date: 09/14/2021 CLINICAL DATA:  Initial treatment strategy for pulmonary nodule. EXAM: NUCLEAR MEDICINE PET SKULL BASE TO THIGH TECHNIQUE: 9.6 mCi F-18 FDG was injected intravenously. Full-ring PET imaging was performed from the  skull base to thigh after the radiotracer. CT data was obtained and used for attenuation correction and anatomic localization. Fasting blood glucose: 97 mg/dl COMPARISON:  Chest CT 09/03/2021 FINDINGS: Mediastinal blood pool activity: SUV max 3.0 Liver activity: SUV max NA NECK: No hypermetabolic lymph nodes in the neck. Incidental CT findings: None. CHEST: Right upper lobe pulmonary nodule  of concern on recent CTA chest is markedly hypermetabolic with SUV max = 9.9. No definite hypermetabolic right hilar lymphadenopathy. 7 mm short axis precarinal node on 54/4 shows low level FDG uptake with SUV max = 3.8. Uptake in the right hilum is SUV max = 10.9 without a discrete hypermetabolic lymph node evident on noncontrast CT imaging. 12 mm subcutaneous nodule anterior left shoulder (25/0) is hypermetabolic with SUV max = 9.3. Incidental CT findings: Mild atherosclerotic calcification is noted in the wall of the thoracic aorta. ABDOMEN/PELVIS: No abnormal hypermetabolic activity within the liver, pancreas, adrenal glands, or spleen. No hypermetabolic lymph nodes in the abdomen or pelvis. Incidental CT findings: There is moderate atherosclerotic calcification of the abdominal aorta without aneurysm. SKELETON: No focal hypermetabolic activity to suggest skeletal metastasis. Sclerosis along the right anterior SI joint is likely degenerative. No hypermetabolism. Incidental CT findings: None. IMPRESSION: 1. Right upper lobe nodule of concern on recent CTA chest is markedly hypermetabolic consistent with primary bronchogenic neoplasm. Low level hypermetabolism seen in the right hilum and associated with a small precarinal lymph node, indeterminate but close attention on follow-up recommended as metastatic disease not excluded. 2. 12 mm superficial subcutaneous nodule anterior left shoulder. This may be infectious/inflammatory and should be amenable to clinical inspection. 3. No unexpected or suspicious hypermetabolism in the neck, abdomen, or pelvis. 4.  Aortic Atherosclerois (ICD10-170.0) Electronically Signed   By: Misty Stanley M.D.   On: 09/14/2021 08:36   DG Chest Port 1 View  Result Date: 09/10/2021 CLINICAL DATA:  S/p bronchoscopy.  [id number] EXAM: PORTABLE CHEST 1 VIEW COMPARISON:  Left x-ray 04/11/2019 FINDINGS: The heart and mediastinal contours are unchanged. Interval development of right peripheral  airspace opacity. No pulmonary edema. No pleural effusion. No pneumothorax. No acute osseous abnormality. IMPRESSION: Interval development of right peripheral airspace opacity. Followup PA and lateral chest X-ray is recommended in 3-4 weeks following therapy to ensure resolution and exclude underlying malignancy. Electronically Signed   By: Iven Finn M.D.   On: 09/10/2021 15:46   DG C-ARM BRONCHOSCOPY  Result Date: 09/10/2021 C-ARM BRONCHOSCOPY: Fluoroscopy was utilized by the requesting physician.  No radiographic interpretation.    Treatments: surgery:  10/02/2021   Patient:  Cornelius Moras Pre-Op Dx: Right upper lobe NSCLC   Post-op Dx:  same Procedure: - Robotic assisted right video thoracoscopy - right upper lobectomy - Mediastinal lymph node sampling - Intercostal nerve block   Surgeon and Role:      * Lightfoot, Lucile Crater, MD - Primary   Assistant: Evonnie Pat, PA-C  Discharge Exam: Blood pressure (!) 120/51, pulse 76, temperature 97.8 F (36.6 C), temperature source Oral, resp. rate 20, height 5\' 7"  (1.702 m), weight 88.9 kg, SpO2 96 %.    General appearance: alert, cooperative, and no distress Heart: regular rate and rhythm Lungs: minor coarseness throughout, improves with cough Abdomen: benign Extremities: warm well perfused Wound: incis healing well   Disposition: Discharge disposition: 01-Home or Self Care     Final Pathology:Pending  Discharge Instructions     Discharge patient   Complete by: As directed    Discharge disposition: 01-Home or Self  Care   Discharge patient date: 10/03/2021      Allergies as of 10/03/2021   No Known Allergies      Medication List     TAKE these medications    clobetasol ointment 0.05 % Commonly known as: TEMOVATE Apply 1 Application topically 2 (two) times daily as needed (rash/irritation.).   Dupixent 300 MG/2ML Sopn Generic drug: Dupilumab Inject 300 mg into the skin every 14 (fourteen) days.   gabapentin 300 MG  capsule Commonly known as: NEURONTIN Take 1 capsule (300 mg total) by mouth 3 (three) times daily as needed. May take 600mg  at bedtime if needed.   ibuprofen 200 MG tablet Commonly known as: ADVIL Take 200 mg by mouth every 8 (eight) hours as needed (pain.).   traMADol 50 MG tablet Commonly known as: ULTRAM Take 1 tablet (50 mg total) by mouth every 6 (six) hours as needed for up to 7 days (mild pain).        Follow-up Information     Lightfoot, Lucile Crater, MD Follow up.   Specialty: Cardiothoracic Surgery Why: Please see discharge paperwork for follow-up appointment with Dr. Kipp Brood.  On the date you see Dr. Kipp Brood please obtain a chest x-ray at [facility] 1/2-hour prior to appointment.  It is located in the same office complex on the first floor. Contact information: [place] [address] [phone]                 Signed: John Giovanni PA-C 10/04/2021, 9:20 AM

## 2021-10-06 ENCOUNTER — Telehealth: Payer: Self-pay | Admitting: Thoracic Surgery (Cardiothoracic Vascular Surgery)

## 2021-10-06 MED ORDER — AZITHROMYCIN 500 MG PO TABS: 500.0000 mg | ORAL_TABLET | Freq: Every day | ORAL | 0 refills | Status: DC

## 2021-10-06 NOTE — Telephone Encounter (Signed)
Patient called with fever 99.8 with spikes up to 102.8 + cough, some bllody mcuous, no wheezing or SOB Will put in script for Azithrmycin 500 mg daily x 5 day Instructed to come by office tomorrow to get CXR  Remo Lipps C. Roxan Hockey, MD [facility] [phone]

## 2021-10-07 ENCOUNTER — Ambulatory Visit (INDEPENDENT_AMBULATORY_CARE_PROVIDER_SITE_OTHER): Payer: Self-pay | Admitting: Physician Assistant

## 2021-10-07 ENCOUNTER — Ambulatory Visit
Admission: RE | Admit: 2021-10-07 | Discharge: 2021-10-07 | Disposition: A | Discharge status: Home / Self Care | Payer: 59 | Source: Ambulatory Visit | Attending: Thoracic Surgery (Cardiothoracic Vascular Surgery) | Admitting: Thoracic Surgery (Cardiothoracic Vascular Surgery)

## 2021-10-07 ENCOUNTER — Other Ambulatory Visit: Payer: Self-pay

## 2021-10-07 VITALS — BP 131/72 | HR 97 | Temp 101.2°F | Resp 18 | Ht 67.0 in | Wt 195.5 lb

## 2021-10-07 DIAGNOSIS — R051 Acute cough: Secondary | ICD-10-CM

## 2021-10-07 DIAGNOSIS — Z902 Acquired absence of lung [part of]: Secondary | ICD-10-CM

## 2021-10-07 NOTE — Progress Notes (Signed)
      [address]       [place], [address]             [phone]   HPI:  Patient returns for routine postoperative follow-up having undergone Robotic Assisted Right Upper Lobectomy on 10/02/2021.  The patient's early postoperative recovery while in the hospital progressed without complication.  The patient contacted the triage line over the weekend with complaints of hemoptysis and fever up to 102.  She was given a prescription for Zithromax and instructed to report to the office today for a CXR. Since hospital discharge the patient reports that her fever persists.  She has not yet started her antibiotic yet.  She continues to have episodes where she is coughing up clumps of bloody sputum.  She is also short of breath at times and states it is painful to take a deep breath.  She denies any urinary symptoms or issues with her surgical incisions.  She has not yet washed these sites with soap and water.  Current Outpatient Medications  Medication Sig Dispense Refill   azithromycin (ZITHROMAX) 500 MG tablet Take 1 tablet (500 mg total) by mouth daily. 5 tablet 0   clobetasol ointment (TEMOVATE) 3.57 % Apply 1 Application topically 2 (two) times daily as needed (rash/irritation.).     Dupilumab (DUPIXENT) 300 MG/2ML SOPN Inject 300 mg into the skin every 14 (fourteen) days.     gabapentin (NEURONTIN) 300 MG capsule Take 1 capsule (300 mg total) by mouth 3 (three) times daily as needed. May take 600mg  at bedtime if needed. 90 capsule 11   ibuprofen (ADVIL) 200 MG tablet Take 200 mg by mouth every 8 (eight) hours as needed (pain.).     traMADol (ULTRAM) 50 MG tablet Take 1 tablet (50 mg total) by mouth every 6 (six) hours as needed for up to 7 days (mild pain). 28 tablet 0   No current facility-administered medications for this visit.    Physical Exam:  BP 131/72 (BP Location: Left Arm, Patient Position: Sitting, Cuff Size: Large)   Pulse 97   Temp (!) 101.2 F (38.4 C)   Resp  18   Ht 5\' 7"  (1.702 m)   Wt 195 lb 8 oz (88.7 kg)   SpO2 92% Comment: RA  BMI 30.62 kg/m   Gen: NAD Heart: RRR, mild tachycardia Lungs: CTA on left, mildly diminished on right  Diagnostic Tests:  CXR: Right hydropneumothorax, new right sided infiltrate in the upper lobe  A/P:  Post operative pneumonia- continue Zithromax as prescribed, if no improvement in fever by Wednesday, may need to transition to Augmentin/Levaquin Hemoptysis- mild bloody sputum, not unexpected post lobectomy, continue to monitor Surgical wounds- no acute signs of infection, instructed patient to wash incisions with soap and water RTC on Friday as scheduled, Dr. Kipp Brood reviewed CXR and will see patient on Friday.  Ellwood Handler, PA-C [facility] [phone]

## 2021-10-10 ENCOUNTER — Ambulatory Visit
Admission: RE | Admit: 2021-10-10 | Discharge: 2021-10-10 | Disposition: A | Discharge status: Home / Self Care | Payer: 59 | Source: Ambulatory Visit | Attending: Neurology | Admitting: Neurology

## 2021-10-10 DIAGNOSIS — R29898 Other symptoms and signs involving the musculoskeletal system: Secondary | ICD-10-CM | POA: Diagnosis not present

## 2021-10-10 DIAGNOSIS — M5416 Radiculopathy, lumbar region: Secondary | ICD-10-CM | POA: Diagnosis not present

## 2021-10-10 DIAGNOSIS — M48062 Spinal stenosis, lumbar region with neurogenic claudication: Secondary | ICD-10-CM

## 2021-10-10 DIAGNOSIS — R200 Anesthesia of skin: Secondary | ICD-10-CM

## 2021-10-10 NOTE — Progress Notes (Unsigned)
[address]       [place] [address]             [phone]        Anna Roth [place] Medical Record #[id number] Date of Birth: 26-Jun-1960  Referring: Garner Nash, DO Primary Care: Lujean Amel, MD Primary Cardiologist:None  Reason for visit:   follow-up  History of Present Illness:     61-year-old female presents for 1 week follow-up appointment after undergoing a robotic assisted lobectomy.  Overall she is doing well.  She denies any shortness of breath.  Physical Exam: BP 122/74   Pulse 98   Resp 20   Ht 5\' 7"  (1.702 m)   Wt 193 lb (87.5 kg)   SpO2 92% Comment: RA  BMI 30.23 kg/m   Alert NAD Incision clean.  Abdomen, ND No peripheral edema   Diagnostic Studies & Laboratory data:  Path: FINAL MICROSCOPIC DIAGNOSIS:   A. LYMPH NODE, LEVEL 9, EXCISION:  - Lymph node, negative for carcinoma (0/1)   B. LYMPH NODE, HILAR, EXCISION:  - Lymph node, negative for carcinoma (0/1)   C. LYMPH NODE, HILAR #2, EXCISION:  - Lymph node, negative for carcinoma (0/1)   D. LYMPH NODE, HILAR #3, EXCISION:  - Lymph node, negative for carcinoma (0/1)   E. LYMPH NODE, HILAR #4, EXCISION:  - Lymph node, negative for carcinoma (0/1)   F. LYMPH NODE, HILAR #5, EXCISION:  - Lymph node, negative for carcinoma (0/1)   G. LYMPH NODE, HILAR #6, EXCISION:  - Lymph node, negative for carcinoma (0/1)   H. LYMPH NODE, HILAR #7, EXCISION:  - Lymph node, negative for carcinoma (0/1)   I. LYMPH NODE, HILAR #8, EXCISION:  - Lymph node, negative for carcinoma (0/1)   J. LYMPH NODE, HILAR #9, EXCISION:  - Lymph node, negative for carcinoma (0/1)   K. LYMPH NODE, LEVEL 4, EXCISION:  - Lymph node, negative for carcinoma (0/1)   L. LUNG, RIGHT UPPER LOBE, LOBECTOMY:  - Invasive moderately differentiated squamous cell carcinoma, 2.7 cm  - Tumor abuts but does not involve visceral pleural surface  - Resection margins are negative for carcinoma  -  Adjacent lung parenchyma with evidence of smoking-related injury  - See oncology table        ONCOLOGY TABLE:   LUNG: Resection   Synchronous Tumors: Not applicable  Total Number of Primary Tumors: 1  Procedure: Lobectomy, lung  Specimen Laterality: Right  Tumor Focality: Unifocal  Tumor Site: Upper lobe  Tumor Size: 2.7 cm  Histologic Type: Invasive squamous cell carcinoma, moderately  differentiated  Visceral Pleura Invasion: Not identified  Direct Invasion of Adjacent Structures: No adjacent structures present  Lymphovascular Invasion: Not identified  Margins: All margins negative for invasive carcinoma       Closest Margin(s) to Invasive Carcinoma: Bronchovascular margin       Margin(s) Involved by Invasive Carcinoma: Not applicable        Margin Status for Non-Invasive Tumor: Not applicable  Treatment Effect: No known presurgical therapy  Regional Lymph Nodes:       Number of Lymph Nodes Involved: 0                            Nodal Sites with Tumor: Not applicable       Number of Lymph Nodes Examined: 11  Nodal Sites Examined: 4, 9 and 10  Distant Metastasis:       Distant Site(s) Involved: Not applicable  Pathologic Stage Classification (pTNM, AJCC 8th Edition): pT1c, pN0     Assessment / Plan:   61 year old female status post robotic assisted right upper lobectomy for squamous cell carcinoma.  T1 cN0 M0.  She will follow-up in 1 month with a chest x-ray.  Her case was discussed in tumor board and she will require further surveillance.   Lajuana Matte 10/11/2021 11:31 AM

## 2021-10-11 ENCOUNTER — Ambulatory Visit (INDEPENDENT_AMBULATORY_CARE_PROVIDER_SITE_OTHER): Payer: Self-pay | Admitting: Thoracic Surgery (Cardiothoracic Vascular Surgery)

## 2021-10-11 ENCOUNTER — Other Ambulatory Visit: Payer: Self-pay | Admitting: *Deleted

## 2021-10-11 VITALS — BP 122/74 | HR 98 | Resp 20 | Ht 67.0 in | Wt 193.0 lb

## 2021-10-11 DIAGNOSIS — Z902 Acquired absence of lung [part of]: Secondary | ICD-10-CM

## 2021-10-11 DIAGNOSIS — R918 Other nonspecific abnormal finding of lung field: Secondary | ICD-10-CM

## 2021-10-11 MED ORDER — TRAMADOL HCL 50 MG PO TABS: 50.0000 mg | ORAL_TABLET | Freq: Four times a day (QID) | ORAL | 0 refills | Status: AC | PRN

## 2021-10-11 NOTE — Progress Notes (Signed)
The proposed treatment discussed in conference is for discussion purpose only and is not a binding recommendation.  The patients have not been physically examined, or presented with their treatment options.  Therefore, final treatment plans cannot be decided.  

## 2021-10-15 ENCOUNTER — Telehealth: Payer: Self-pay | Admitting: Internal Medicine

## 2021-10-15 NOTE — Telephone Encounter (Signed)
Scheduled appt per 9/15 referral. Pt is aware of appt date and time. Pt is aware to arrive 15 mins prior to appt time and to bring and updated insurance card. Pt is aware of appt location.   

## 2021-10-16 ENCOUNTER — Telehealth: Payer: Self-pay | Admitting: *Deleted

## 2021-10-16 ENCOUNTER — Telehealth: Payer: Self-pay

## 2021-10-16 DIAGNOSIS — M48061 Spinal stenosis, lumbar region without neurogenic claudication: Secondary | ICD-10-CM

## 2021-10-16 NOTE — Telephone Encounter (Signed)
Spoke with patient and discussed results of MRI lumbar spine. Pt aware she has severe lumbar stenosis and recommendation is to refer her for surgical evaluation at [facility]. Pt's questions were answered and she agrees to the referral. I gave her their number to call if she has not heard from them within 2 weeks.

## 2021-10-16 NOTE — Telephone Encounter (Signed)
-----   Message from Melvenia Beam, MD sent at 10/14/2021  8:09 PM EDT ----- Pod 4: Patient has severe lumbar stenosis, I recommend surgical evaluation at [facility]. If she agrees please refer her thanks.

## 2021-10-16 NOTE — Telephone Encounter (Signed)
FMLA form completed and faxed to [employer] per pt's request/ Beginning LOA 10/02/21 through approx. 12/30/21

## 2021-10-16 NOTE — Telephone Encounter (Signed)
Spoke with Dr Jaynee Eagles. Referral order placed for [facility] Neuro[facility] and spine.

## 2021-10-22 ENCOUNTER — Telehealth: Payer: Self-pay | Admitting: Neurology

## 2021-10-22 NOTE — Telephone Encounter (Signed)
Referral sent to [facility] Neurosurgery [phone]

## 2021-11-07 ENCOUNTER — Encounter: Payer: Self-pay | Admitting: Internal Medicine

## 2021-11-07 ENCOUNTER — Inpatient Hospital Stay: Payer: 59 | Attending: Internal Medicine | Admitting: Internal Medicine

## 2021-11-07 ENCOUNTER — Other Ambulatory Visit: Payer: Self-pay

## 2021-11-07 ENCOUNTER — Inpatient Hospital Stay: Payer: 59

## 2021-11-07 ENCOUNTER — Other Ambulatory Visit: Payer: Self-pay | Admitting: Medical Oncology

## 2021-11-07 VITALS — BP 158/88 | HR 87 | Temp 98.0°F | Resp 18 | Ht 67.0 in | Wt 194.4 lb

## 2021-11-07 DIAGNOSIS — C3490 Malignant neoplasm of unspecified part of unspecified bronchus or lung: Secondary | ICD-10-CM

## 2021-11-07 DIAGNOSIS — C3411 Malignant neoplasm of upper lobe, right bronchus or lung: Secondary | ICD-10-CM

## 2021-11-07 LAB — CBC WITH DIFFERENTIAL (CANCER CENTER ONLY)
Abs Immature Granulocytes: 0.04 10*3/uL (ref 0.00–0.07)
Basophils Absolute: 0.1 10*3/uL (ref 0.0–0.1)
Basophils Relative: 1 %
Eosinophils Absolute: 0.1 10*3/uL (ref 0.0–0.5)
Eosinophils Relative: 1 %
HCT: 41.4 % (ref 36.0–46.0)
Hemoglobin: 13.6 g/dL (ref 12.0–15.0)
Immature Granulocytes: 0 %
Lymphocytes Relative: 42 %
Lymphs Abs: 4.4 10*3/uL — ABNORMAL HIGH (ref 0.7–4.0)
MCH: 27.5 pg (ref 26.0–34.0)
MCHC: 32.9 g/dL (ref 30.0–36.0)
MCV: 83.6 fL (ref 80.0–100.0)
Monocytes Absolute: 0.8 10*3/uL (ref 0.1–1.0)
Monocytes Relative: 8 %
Neutro Abs: 5.1 10*3/uL (ref 1.7–7.7)
Neutrophils Relative %: 48 %
Platelet Count: 211 10*3/uL (ref 150–400)
RBC: 4.95 MIL/uL (ref 3.87–5.11)
RDW: 14.5 % (ref 11.5–15.5)
WBC Count: 10.5 10*3/uL (ref 4.0–10.5)
nRBC: 0 % (ref 0.0–0.2)

## 2021-11-07 LAB — CMP (CANCER CENTER ONLY)
ALT: 13 U/L (ref 0–44)
AST: 15 U/L (ref 15–41)
Albumin: 4.3 g/dL (ref 3.5–5.0)
Alkaline Phosphatase: 97 U/L (ref 38–126)
Anion gap: 7 (ref 5–15)
BUN: 12 mg/dL (ref 8–23)
CO2: 29 mmol/L (ref 22–32)
Calcium: 9.5 mg/dL (ref 8.9–10.3)
Chloride: 102 mmol/L (ref 98–111)
Creatinine: 0.74 mg/dL (ref 0.44–1.00)
GFR, Estimated: >60 mL/min (ref >60)
Glucose, Bld: 106 mg/dL — ABNORMAL HIGH (ref 70–99)
Potassium: 4.1 mmol/L (ref 3.5–5.1)
Sodium: 138 mmol/L (ref 135–145)
Total Bilirubin: 0.6 mg/dL (ref 0.3–1.2)
Total Protein: 7.9 g/dL (ref 6.5–8.1)

## 2021-11-07 NOTE — Progress Notes (Signed)
[place] Telephone:([phone]) [phone]   Fax:([phone]  CONSULT NOTE  REFERRING PHYSICIAN: Dr. Melodie Bouillon  REASON FOR CONSULTATION:  61 years old African-American female recently diagnosed with lung cancer.  HPI Anna Roth is a 61 y.o. female with past medical history significant for COPD, osteoarthritis, anemia, hypertension as well as pneumonia.  The patient had a history of deep venous thrombosis in the past and she had swelling of the lower extremities left more than right.  She presented to the emergency department at the med center [facility] for evaluation.  She had Doppler of the left lower extremity that showed no evidence of deep venous thrombosis in the left lower extremity.  She also had CT angiogram of the chest on 09/03/2021 and that showed no acute cardiopulmonary disease and no evidence for pulmonary embolism but there was a regular 1.4 cm nodule over the right upper lobe concerning for primary bronchogenic carcinoma.  The patient was seen by Dr. Valeta Harms and she had a bronchoscopy on 09/10/2021 and the final cytology showed malignant cells consistent with squamous cell carcinoma.  A PET scan was performed on 09/12/2021 and it showed right upper lobe nodule of concern on the CT scan was markedly hypermetabolic consistent with primary bronchogenic neoplasm with low-level hypermetabolism seen in the right hilum and associated with a small precarinal lymph node that indeterminate.  The patient was referred to Dr. Kipp Brood and on 10/02/2021 she underwent robotic assisted right video thoracoscopy with right upper lobectomy and mediastinal lymph node sampling.  The final pathology ([id number]) showed invasive squamous cell carcinoma, moderately differentiated with no evidence for visceral pleural or lymphovascular invasion and the dissected lymph nodes were negative for malignancy.  The tumor measured 2.7 cm in size. The patient was referred to me today for evaluation  and recommendation regarding treatment of her condition. When seen today the patient is feeling fine with no concerning complaints except for fatigue and shortness of breath with exertion as well as mild wheezing but no significant chest pain, cough or hemoptysis.  She denied having any current nausea, vomiting, diarrhea or constipation.  She has no headache or visual changes. Family history significant for father with back surgery.  Mother had alcoholism and schizophrenia. The patient is married and has 4 children 2 sons and 2 daughters.  She works as a Psychologist, sport and exercise in Engineering geologist.  She has a history for smoking more than 1 pack/day for around 46 years and she quit in August 2023.  She drinks alcohol occasionally no history of drug abuse.  HPI  Past Medical History:  Diagnosis Date   Arthritis    COPD (chronic obstructive pulmonary disease) ([place])    Deep vein thrombophlebitis of leg ([place])    H/O: Bell's palsy    History of Anemia    during pregnancy   Hypertension    Lung cancer ([place]) 2023   Pneumonia     Past Surgical History:  Procedure Laterality Date   BLADDER SUSPENSION     BRONCHIAL BIOPSY  09/10/2021   Procedure: BRONCHIAL BIOPSIES;  Surgeon: Garner Nash, DO;  Location: [facility];  Service: Pulmonary;;   BRONCHIAL NEEDLE ASPIRATION BIOPSY  09/10/2021   Procedure: BRONCHIAL NEEDLE ASPIRATION BIOPSIES;  Surgeon: Garner Nash, DO;  Location: [facility];  Service: Pulmonary;;   CARPAL TUNNEL RELEASE Bilateral    FINE NEEDLE ASPIRATION  09/10/2021   Procedure: FINE NEEDLE ASPIRATION;  Surgeon: Garner Nash, DO;  Location: [facility];  Service: Pulmonary;;   INTERCOSTAL NERVE BLOCK Right 10/02/2021   Procedure: INTERCOSTAL NERVE BLOCK;  Surgeon: Lajuana Matte, MD;  Location: [facility];  Service: Thoracic;  Laterality: Right;   neck fusion     x4   NODE DISSECTION Right 10/02/2021   Procedure: NODE DISSECTION;  Surgeon: Lajuana Matte, MD;  Location:  [facility];  Service: Thoracic;  Laterality: Right;   VIDEO BRONCHOSCOPY WITH ENDOBRONCHIAL ULTRASOUND Bilateral 09/10/2021   Procedure: VIDEO BRONCHOSCOPY WITH ENDOBRONCHIAL ULTRASOUND;  Surgeon: Garner Nash, DO;  Location: [facility];  Service: Pulmonary;  Laterality: Bilateral;    Family History  Problem Relation Age of Onset   Diabetes Mother    Stroke Sister    Cancer Paternal Aunt        stomach   Cancer Maternal Grandmother        stomach   Cancer Paternal Grandmother        stomach     Social History Social History   Tobacco Use   Smoking status: Former    Packs/day: 0.50    Years: 42.00    Total pack years: 21.00    Types: Cigarettes    Start date: 19    Quit date: 09/26/2021    Years since quitting: 0.1   Smokeless tobacco: Never  Vaping Use   Vaping Use: Never used  Substance Use Topics   Alcohol use: Yes    Alcohol/week: 21.0 standard drinks of alcohol    Types: 21 Shots of liquor per week   Drug use: Never    No Known Allergies  Current Outpatient Medications  Medication Sig Dispense Refill   clobetasol ointment (TEMOVATE) 2.70 % Apply 1 Application topically 2 (two) times daily as needed (rash/irritation.).     Dupilumab (DUPIXENT) 300 MG/2ML SOPN Inject 300 mg into the skin every 14 (fourteen) days.     gabapentin (NEURONTIN) 300 MG capsule Take 1 capsule (300 mg total) by mouth 3 (three) times daily as needed. May take 600mg  at bedtime if needed. 90 capsule 11   ibuprofen (ADVIL) 200 MG tablet Take 200 mg by mouth every 8 (eight) hours as needed (pain.).     No current facility-administered medications for this visit.    Review of Systems  Constitutional: positive for fatigue Eyes: negative Ears, nose, mouth, throat, and face: negative Respiratory: positive for dyspnea on exertion and wheezing Cardiovascular: negative Gastrointestinal: negative Genitourinary:negative Integument/breast: negative Hematologic/lymphatic:  negative Musculoskeletal:negative Neurological: negative Behavioral/Psych: negative Endocrine: negative Allergic/Immunologic: negative  Physical Exam  WCB:JSEGB, healthy, no distress, well nourished, and well developed SKIN: skin color, texture, turgor are normal, no rashes or significant lesions HEAD: Normocephalic, No masses, lesions, tenderness or abnormalities EYES: normal, PERRLA, Conjunctiva are pink and non-injected EARS: External ears normal, Canals clear OROPHARYNX:no exudate, no erythema, and lips, buccal mucosa, and tongue normal  NECK: supple, no adenopathy, no JVD LYMPH:  no palpable lymphadenopathy, no hepatosplenomegaly BREAST:not examined LUNGS: clear to auscultation , and palpation HEART: regular rate & rhythm, no murmurs, and no gallops ABDOMEN:abdomen soft, non-tender, normal bowel sounds, and no masses or organomegaly BACK: Back symmetric, no curvature., No CVA tenderness EXTREMITIES:no joint deformities, effusion, or inflammation, no edema  NEURO: alert & oriented x 3 with fluent speech, no focal motor/sensory deficits  PERFORMANCE STATUS: ECOG 1  LABORATORY DATA: Lab Results  Component Value Date   WBC 10.5 11/07/2021   HGB 13.6 11/07/2021   HCT 41.4 11/07/2021   MCV 83.6 11/07/2021   PLT 211 11/07/2021  Chemistry      Component Value Date/Time   NA 135 10/03/2021 0427   NA 140 05/27/2016 1026   K 3.9 10/03/2021 0427   CL 101 10/03/2021 0427   CO2 23 10/03/2021 0427   BUN 14 10/03/2021 0427   BUN 9 05/27/2016 1026   CREATININE 0.86 10/03/2021 0427      Component Value Date/Time   CALCIUM 9.1 10/03/2021 0427   ALKPHOS 76 10/02/2021 1028   AST 25 10/02/2021 1028   ALT 23 10/02/2021 1028   BILITOT 0.8 10/02/2021 1028   BILITOT 0.3 05/27/2016 1026       RADIOGRAPHIC STUDIES: MR LUMBAR SPINE WO CONTRAST  Result Date: 10/11/2021  [facility] [address], [place], [place] [address] [phone] NEUROIMAGING  REPORT STUDY DATE: 10/10/2021 PATIENT NAME: JAVAYAH MAGAW DOB: 1960/09/05 MRN: [id number] EXAM: MRI of the lumbar spine without contrast ORDERING CLINICIAN: Sarina Ill, MD CLINICAL HISTORY: 61 year old woman with spinal stenosis, lumbar radiculopathy left leg pain and weakness COMPARISON FILMS: None TECHNIQUE: MRI of the lumbar spine was obtained utilizing 4 mm sagittal slices from F79-02 down to the lower sacrum with T1, T2 and inversion recovery views. In addition 4 mm axial slices from I0-9 down to L5-S1 level were included with T1 and T2 weighted views. CONTRAST: None IMAGING SITE: [facility], [address] [place], [place], [place] FINDINGS: On sagittal images, the spine is imaged from T11 to the sacrum.   Paravertebral soft tissue appears normal.  The conus medullaris and cauda equine appear normal.   Borderline 1 mm anterolisthesis of L4 4 upon L5..   The vertebral bodies have normal signal.  The discs and interspaces were further evaluated on axial views from T12 to S1 as follows: T11-T12: Seen only on the sagittal images, there is mild disc bulging at T11-T12 causing minimal foraminal narrowing but no nerve root compression or spinal stenosis. T12-L1: The disc and interspace appear normal. L1-L2: The disc and interspace appear normal. L2-L3: There is disc bulging and right facet hypertrophy, the central canal is mildly narrowed but not enough to be considered spinal stenosis.  There is minimal foraminal narrowing but no nerve root compression. L3-L4: There is moderate spinal stenosis (AP diameter 7.3 mm) due to disc bulging, severe facet hypertrophy, left greater than right and ligamenta flava hypertrophy.  Additionally, there is mild bilateral foraminal narrowing and moderate bilateral lateral recess stenosis.  There is no definite nerve root compression. L4-L5: There is severe spinal stenosis (AP diameter 5.8 mm) due to disc protrusion, 1 mm of anterolisthesis, severe facet hypertrophy and ligamenta flava  hypertrophy.  Additionally, there is mild bilateral foraminal narrowing, moderately severe left and moderate right lateral recess stenosis which could affect the left L5 nerve root. L5-S1: There is severe facet hypertrophy causing mild foraminal narrowing but no spinal stenosis or nerve root compression.   This MRI of the lumbar spine without contrast shows the following: At L3-L4, there is moderate spinal stenosis due to degenerative changes including severe facet hypertrophy.  There is moderate lateral recess stenosis though there does not appear to be nerve root compression. At L4-L5, there is severe spinal stenosis due to disc protrusion, minimal anterolisthesis and severe facet hypertrophy.  This also causes left greater than right lateral recess stenosis which could affect the left L5 nerve root. Degenerative changes at T11-T12, L2-L3 and L5-S1 do not lead to spinal stenosis or nerve root compression. INTERPRETING PHYSICIAN: Richard A. Felecia Shelling, MD, PhD, FAAN Certified in  Neuroimaging  by American Society of Neuroimaging    ASSESSMENT: This is a very pleasant 61 years old African-American female recently diagnosed with a stage Ia (T1c, N0, M0) non-small cell lung cancer, squamous cell carcinoma presented with right upper lobe lung nodule status post right upper lobectomy with lymph node sampling under the care of Dr. Kipp Brood on October 02, 2021.   PLAN: I had a lengthy discussion with the patient today about her current disease stage, prognosis and treatment options. I explained to the patient that she did have curative treatment option for her lung cancer with the surgical resection. I also explained to the patient that there is no survival benefit for adjuvant systemic chemotherapy or radiation for patient with tumor size of 4.0 cm or less. I recommended for the patient to continue on observation with repeat CT scan of the chest in 6 months. I will see her back for follow-up visit at that time. I  strongly encouraged the patient to continue with the smoking cessation. She was advised to call immediately if she has any other concerning symptoms in the interval. The patient voices understanding of current disease status and treatment options and is in agreement with the current care plan.  All questions were answered. The patient knows to call the clinic with any problems, questions or concerns. We can certainly see the patient much sooner if necessary.  Thank you so much for allowing me to participate in the care of [place]. I will continue to follow up the patient with you and assist in her care.  The total time spent in the appointment was 60 minutes.  Disclaimer: This note was dictated with voice recognition software. Similar sounding words can inadvertently be transcribed and may not be corrected upon review.   Eilleen Kempf November 07, 2021, 2:11 PM

## 2021-11-13 ENCOUNTER — Other Ambulatory Visit: Payer: Self-pay | Admitting: Thoracic Surgery (Cardiothoracic Vascular Surgery)

## 2021-11-13 DIAGNOSIS — C3490 Malignant neoplasm of unspecified part of unspecified bronchus or lung: Secondary | ICD-10-CM

## 2021-11-13 DIAGNOSIS — C3411 Malignant neoplasm of upper lobe, right bronchus or lung: Secondary | ICD-10-CM

## 2021-11-15 ENCOUNTER — Ambulatory Visit (INDEPENDENT_AMBULATORY_CARE_PROVIDER_SITE_OTHER): Payer: Self-pay | Admitting: Thoracic Surgery (Cardiothoracic Vascular Surgery)

## 2021-11-15 ENCOUNTER — Ambulatory Visit
Admission: RE | Admit: 2021-11-15 | Discharge: 2021-11-15 | Disposition: A | Discharge status: Home / Self Care | Payer: 59 | Source: Ambulatory Visit | Attending: Thoracic Surgery (Cardiothoracic Vascular Surgery) | Admitting: Thoracic Surgery (Cardiothoracic Vascular Surgery)

## 2021-11-15 VITALS — BP 162/87 | HR 106 | Resp 18 | Ht 67.0 in | Wt 197.0 lb

## 2021-11-15 DIAGNOSIS — Z902 Acquired absence of lung [part of]: Secondary | ICD-10-CM

## 2021-11-15 DIAGNOSIS — C3411 Malignant neoplasm of upper lobe, right bronchus or lung: Secondary | ICD-10-CM

## 2021-11-15 DIAGNOSIS — C3490 Malignant neoplasm of unspecified part of unspecified bronchus or lung: Secondary | ICD-10-CM

## 2021-11-15 NOTE — Progress Notes (Signed)
      [address]       [place] [address]             [phone]        Jennice L Rettinger [facility] Medical Record #[id number] Date of Birth: Jul 06, 1960  Referring: Garner Nash, DO Primary Care: Lujean Amel, MD Primary Cardiologist:None  Reason for visit:   follow-up  History of Present Illness:     61 year old female presents for her 1 month follow-up appointment.  Overall she is doing well.  She occasionally has some shortness of breath.  Physical Exam: BP (!) 162/87 (BP Location: Right Arm, Patient Position: Sitting)   Pulse (!) 106   Resp 18   Ht 5\' 7"  (1.702 m)   Wt 197 lb (89.4 kg)   SpO2 92% Comment: RA  BMI 30.85 kg/m   Alert NAD Incision clean.   Abdomen, ND No peripheral edema   Diagnostic Studies & Laboratory data: CXR: Clear     Assessment / Plan:   61 year old female with history of a T1 cN0 M0 squamous cell carcinoma of the right upper lobe.  She is cleared from surgical standpoint to resume all normal activities.  She will continue surveillance through the cancer center.  She will follow-up with [place] as needed.   Lajuana Matte 11/15/2021 12:41 PM

## 2021-11-19 NOTE — Progress Notes (Signed)
Return to work note as requested by patient. She was seen by Dr. Kipp Brood 10/20 and cleared from a surgical standpoint.

## 2021-11-20 ENCOUNTER — Ambulatory Visit: Payer: 59 | Attending: Neurosurgery | Admitting: Physical Therapy

## 2021-11-20 ENCOUNTER — Encounter: Payer: Self-pay | Admitting: Physical Therapy

## 2021-11-20 ENCOUNTER — Other Ambulatory Visit: Payer: Self-pay

## 2021-11-20 DIAGNOSIS — M4316 Spondylolisthesis, lumbar region: Secondary | ICD-10-CM | POA: Diagnosis not present

## 2021-11-20 DIAGNOSIS — M5416 Radiculopathy, lumbar region: Secondary | ICD-10-CM

## 2021-11-20 DIAGNOSIS — M5459 Other low back pain: Secondary | ICD-10-CM

## 2021-11-20 DIAGNOSIS — M6281 Muscle weakness (generalized): Secondary | ICD-10-CM | POA: Diagnosis not present

## 2021-11-20 NOTE — Therapy (Signed)
OUTPATIENT PHYSICAL THERAPY THORACOLUMBAR EVALUATION   Patient Name: Anna Roth MRN: [id number] DOB:10-16-1960, 61 y.o., female Today's Date: 11/20/2021   PT End of Session - 11/20/21 1324     Visit Number 1    Date for PT Re-Evaluation 01/15/22    Authorization Type UHC    Authorization - Number of Visits 60    Progress Note Due on Visit 10    PT Start Time 0845    PT Stop Time 0930    PT Time Calculation (min) 45 min    Activity Tolerance Patient tolerated treatment well    Behavior During Therapy WFL for tasks assessed/performed             Past Medical History:  Diagnosis Date   Arthritis    COPD (chronic obstructive pulmonary disease) ([place])    Deep vein thrombophlebitis of leg ([place])    H/O: Bell's palsy    History of Anemia    during pregnancy   Hypertension    Lung cancer ([place]) 2023   Pneumonia    Past Surgical History:  Procedure Laterality Date   BLADDER SUSPENSION     BRONCHIAL BIOPSY  09/10/2021   Procedure: BRONCHIAL BIOPSIES;  Surgeon: Garner Nash, DO;  Location: [facility];  Service: Pulmonary;;   BRONCHIAL NEEDLE ASPIRATION BIOPSY  09/10/2021   Procedure: BRONCHIAL NEEDLE ASPIRATION BIOPSIES;  Surgeon: Garner Nash, DO;  Location: [facility];  Service: Pulmonary;;   CARPAL TUNNEL RELEASE Bilateral    FINE NEEDLE ASPIRATION  09/10/2021   Procedure: FINE NEEDLE ASPIRATION;  Surgeon: Garner Nash, DO;  Location: [facility];  Service: Pulmonary;;   INTERCOSTAL NERVE BLOCK Right 10/02/2021   Procedure: INTERCOSTAL NERVE BLOCK;  Surgeon: Lajuana Matte, MD;  Location: [facility];  Service: Thoracic;  Laterality: Right;   neck fusion     x4   NODE DISSECTION Right 10/02/2021   Procedure: NODE DISSECTION;  Surgeon: Lajuana Matte, MD;  Location: [facility];  Service: Thoracic;  Laterality: Right;   VIDEO BRONCHOSCOPY WITH ENDOBRONCHIAL ULTRASOUND Bilateral 09/10/2021   Procedure: VIDEO BRONCHOSCOPY WITH ENDOBRONCHIAL ULTRASOUND;  Surgeon:  Garner Nash, DO;  Location: [facility];  Service: Pulmonary;  Laterality: Bilateral;   Patient Active Problem List   Diagnosis Date Noted   Lung cancer ([place]) 10/02/2021   S/P lobectomy of lung 10/02/2021   Arthropathy of lumbar facet joint 09/27/2021   Chronic low back pain 09/27/2021   Status post cervical spinal fusion 09/27/2021   Pseudoarthrosis of cervical spine ([place]) 09/27/2021   Cervical myofascial pain syndrome 09/24/2021   Lumbar back pain with radiculopathy affecting left lower extremity 09/24/2021   Lung mass 09/05/2021   Adenopathy 09/05/2021   Vitamin D deficiency 03/28/2021   Hypercholesterolemia 12/01/2019   Abdominal bloating 11/07/2019    PCP: Dibas Koirala, MD  REFERRING PROVIDER: Newman Pies, MD   REFERRING DIAG: M43.16 (ICD-10-CM) - Spondylolisthesis, lumbar region   Rationale for Evaluation and Treatment Rehabilitation  THERAPY DIAG:  Radiculopathy, lumbar region  Other low back pain  Muscle weakness (generalized)  ONSET DATE: 10 years ago  SUBJECTIVE:  SUBJECTIVE STATEMENT: Pt referred to OPPT for bil LBP and bil leg pain and weakness and numbness Lt>Rt x 10 years, worsened over past 2-3 years.  Pt is a surgical candidate but is going to try PT first.  Pt has been out of work since 09/27/21 for surgery for lung cancer but has been cleared.  She returns to work on 11/25/21.  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 Yes NPRS scale: 7/10 Pain location: bil LBP and bil LE pain/weakness/numbness Lt>Rt Pain orientation: Bilateral  PAIN TYPE:  burning and shooting, weakness, leg gets stuck and can't move Pain description: constant, tingling, and pins/needles   Aggravating factors: standing, walking - varies for tolerance Relieving factors: sitting, ibuprofen in AM 800mg , laying down on side pillow b/w legs   PRECAUTIONS: None  WEIGHT BEARING RESTRICTIONS: No  FALLS:  Has patient fallen in last 6 months? No  LIVING ENVIRONMENT: Lives with: lives with their family Lives in: House/apartment Stairs: No Has following equipment at home: grab bar in shower with handheld shower head  OCCUPATION: medical assistant, full time  PLOF: Independent  PATIENT GOALS: have less pain   OBJECTIVE:   DIAGNOSTIC FINDINGS:  09/2021 LUMBAR MRI IMPRESSION:  This MRI of the lumbar spine without contrast shows the following: At L3-L4, there is moderate spinal stenosis due to degenerative changes including severe facet hypertrophy.  There is moderate lateral recess stenosis though there does not appear to be nerve root compression. At L4-L5, there is severe spinal stenosis due to disc protrusion, minimal anterolisthesis and severe facet hypertrophy.  This also causes left greater than right lateral recess stenosis which could affect the left L5 nerve root. Degenerative changes at T11-T12, L2-L3 and L5-S1 do not lead to spinal stenosis or nerve root compression.  PATIENT SURVEYS:  FOTO 47% goal 55%  SCREENING FOR RED FLAGS: Bowel or bladder incontinence: No Spinal tumors: No Cauda equina syndrome: No Compression fracture: No Abdominal aneurysm: No  COGNITION: Overall cognitive status: Within functional limits for tasks assessed     SENSATION: Bil LE numbness, pins/needles/weakness  MUSCLE LENGTH: Hamstrings: Right 50 deg; Left 50 deg Limited end range gluteals, piriformis  POSTURE: decreased thoracic kyphosis  PALPATION: Diffuse tenderness along lumbar, bil hips and thighs  LUMBAR ROM:   AROM eval  Flexion Fingers to toes   Extension 10, pain  Right lateral flexion full  Left lateral flexion full  Right rotation full  Left rotation full   (Blank rows = not tested)  LOWER EXTREMITY STRENGTH:     MMT Right eval Left eval  Hip flexion 3+ 3+  Hip extension 4 4  Hip abduction 3+ 3+  Hip adduction 4+ 4+  Hip internal rotation 4+ 4+  Hip external rotation 4 4  Knee flexion 4+ 4+  Knee extension 4- 4-  Ankle dorsiflexion 4 4  Ankle plantarflexion 4 4  Ankle inversion 4 4  Ankle eversion 4 4   (Blank rows = not tested)  LOWER EXTREMITY ROM:    PASSIVE Right eval Left eval  Hip flexion 95 95  Hip extension    Hip abduction    Hip adduction    Hip internal rotation    Hip external rotation End range limited End range limited  Knee flexion  Knee extension    Ankle dorsiflexion    Ankle plantarflexion    Ankle inversion    Ankle eversion     (Blank rows = not tested)  LUMBAR SPECIAL TESTS:  Straight leg raise test: Positive bil  FUNCTIONAL TESTS:  5 times sit to stand: 17 sec no hands  GAIT: Distance walked: 50 Assistive device utilized: None Level of assistance: Complete Independence Comments: no signif gait deviations  TODAY'S TREATMENT:                                                                                                                                DATE: 11/20/21 (EVAL):  Initated HEP  PATIENT EDUCATION:  Education details: Access Code: [email or web address] Person educated: Patient Education method: Consulting civil engineer, Media planner, Verbal cues, and Handouts Education comprehension: verbalized understanding and returned demonstration  HOME EXERCISE PROGRAM: Access Code: [email or web address] URL: [email or web address] Date: 11/20/2021 Prepared by: Venetia Night [place]  Exercises - Supine Posterior Pelvic Tilt  - 2 x daily - 7 x weekly - 3 sets - 10 reps - 5 hold - Supine Lower Trunk Rotation  - 2 x daily - 7 x weekly - 1 sets - 5 reps - 10 hold - Supine Double Knee to Chest   - 2 x daily - 7 x weekly - 1 sets - 5 reps - 10 hold - Supine Piriformis Stretch with Foot on Ground  - 2 x daily - 7 x weekly - 1 sets - 3 reps - 20 hold - Supine Figure 4 Piriformis Stretch  - 2 x daily - 7 x weekly - 1 sets - 3 reps - 20 hold  ASSESSMENT:  CLINICAL IMPRESSION: Patient is a 62 y.o. female who was seen today for physical therapy evaluation and treatment for LBP and bil LE pain, weakness and numbness/tingling secondary to spinal stenosis.  She is referred to PT in an effort to see if conservative treatment will help her symptoms and if not, she is to return to MD to explore other options.  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  Symptoms are worse with prolonged standing and walking and relieved with sitting and lying down.  She has weakness in bil LE but is ind with gait, transfers.  Pt will benefit from skilled PT to address pain and deficits to maximize function.  OBJECTIVE IMPAIRMENTS: Abnormal gait, decreased activity tolerance, decreased balance, decreased coordination, decreased endurance, decreased mobility, difficulty walking, decreased ROM, decreased strength, hypomobility, increased muscle spasms, impaired flexibility, impaired sensation, impaired tone, improper body mechanics, postural dysfunction, and pain.   ACTIVITY LIMITATIONS: carrying, lifting, bending, standing, squatting, stairs, transfers, bathing, toileting, dressing, and locomotion level  PARTICIPATION LIMITATIONS: meal prep, cleaning, laundry, shopping, community activity, and yard work  PERSONAL FACTORS: Time since onset of injury/illness/exacerbation and 1 comorbidity: recent diagnosis of lung cancer  are also affecting patient's functional outcome.  REHAB POTENTIAL: Good  CLINICAL DECISION MAKING: Stable/uncomplicated  EVALUATION COMPLEXITY: Low   GOALS: Goals reviewed with patient? Yes  SHORT TERM GOALS: Target  date: 12/18/2021  Pt will be ind with initial HEP without exacerbation of symptoms. Baseline: Goal status: INITIAL  2.  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 Baseline:  Goal status: INITIAL  3.  Pt will report at least 20% reduction in pain throughout work day. Baseline:  Goal status: INITIAL    LONG TERM GOALS: Target date: 01/15/2022  Pt will be ind with advanced HEP and be educated on options for conservative management of symptoms. Baseline:  Goal status: INITIAL  2.  Improve FOTO score to 55% or greater to demo improved function. Baseline: 47% Goal status: INITIAL  3.  Pt will report pain not to exceed 3/10 with work day. Baseline:  Goal status: INITIAL  4.  Pt will improve bil LE strength to at least 4+/5 for all muscle groups to improve endurance for housework and occupation. Baseline:  Goal status: INITIAL    PLAN:  PT FREQUENCY: 1-2x/week  PT DURATION: 8 weeks  PLANNED INTERVENTIONS: Therapeutic exercises, Therapeutic activity, Neuromuscular re-education, Balance training, Gait training, Patient/Family education, Self Care, Joint mobilization, Aquatic Therapy, Dry Needling, Electrical stimulation, Spinal mobilization, Cryotherapy, Moist heat, Taping, Traction, and Manual therapy.  PLAN FOR NEXT SESSION: NuStep, review HEP and progress lumbar stabilization, try sciatic flossing to see if tolerated  Ayomide Zuleta, PT 11/20/21 1:33 PM

## 2021-11-28 ENCOUNTER — Ambulatory Visit: Payer: 59 | Attending: Neurosurgery

## 2021-11-28 DIAGNOSIS — M6281 Muscle weakness (generalized): Secondary | ICD-10-CM | POA: Insufficient documentation

## 2021-11-28 DIAGNOSIS — M5459 Other low back pain: Secondary | ICD-10-CM | POA: Diagnosis present

## 2021-11-28 DIAGNOSIS — R252 Cramp and spasm: Secondary | ICD-10-CM | POA: Insufficient documentation

## 2021-11-28 DIAGNOSIS — R262 Difficulty in walking, not elsewhere classified: Secondary | ICD-10-CM | POA: Diagnosis present

## 2021-11-28 DIAGNOSIS — M5416 Radiculopathy, lumbar region: Secondary | ICD-10-CM | POA: Insufficient documentation

## 2021-11-28 NOTE — Therapy (Signed)
OUTPATIENT PHYSICAL THERAPY THORACOLUMBAR TREATMENT NOTE   Patient Name: TOMORROW DEHAAS MRN: [id number] DOB:May 24, 1960, 61 y.o., female Today's Date: 11/28/2021   PT End of Session - 11/28/21 1441     Visit Number 2    Date for PT Re-Evaluation 01/15/22    Authorization Type UHC    Authorization - Number of Visits 60    Progress Note Due on Visit 10    PT Start Time 3662    PT Stop Time 1525    PT Time Calculation (min) 40 min    Activity Tolerance Patient tolerated treatment well    Behavior During Therapy WFL for tasks assessed/performed             Past Medical History:  Diagnosis Date   Arthritis    COPD (chronic obstructive pulmonary disease) ([place])    Deep vein thrombophlebitis of leg ([place])    H/O: Bell's palsy    History of Anemia    during pregnancy   Hypertension    Lung cancer ([place]) 2023   Pneumonia    Past Surgical History:  Procedure Laterality Date   BLADDER SUSPENSION     BRONCHIAL BIOPSY  09/10/2021   Procedure: BRONCHIAL BIOPSIES;  Surgeon: Garner Nash, DO;  Location: [facility];  Service: Pulmonary;;   BRONCHIAL NEEDLE ASPIRATION BIOPSY  09/10/2021   Procedure: BRONCHIAL NEEDLE ASPIRATION BIOPSIES;  Surgeon: Garner Nash, DO;  Location: [facility];  Service: Pulmonary;;   CARPAL TUNNEL RELEASE Bilateral    FINE NEEDLE ASPIRATION  09/10/2021   Procedure: FINE NEEDLE ASPIRATION;  Surgeon: Garner Nash, DO;  Location: [facility];  Service: Pulmonary;;   INTERCOSTAL NERVE BLOCK Right 10/02/2021   Procedure: INTERCOSTAL NERVE BLOCK;  Surgeon: Lajuana Matte, MD;  Location: [facility];  Service: Thoracic;  Laterality: Right;   neck fusion     x4   NODE DISSECTION Right 10/02/2021   Procedure: NODE DISSECTION;  Surgeon: Lajuana Matte, MD;  Location: [facility];  Service: Thoracic;  Laterality: Right;   VIDEO BRONCHOSCOPY WITH ENDOBRONCHIAL ULTRASOUND Bilateral 09/10/2021   Procedure: VIDEO BRONCHOSCOPY WITH ENDOBRONCHIAL ULTRASOUND;  Surgeon:  Garner Nash, DO;  Location: [facility];  Service: Pulmonary;  Laterality: Bilateral;   Patient Active Problem List   Diagnosis Date Noted   Lung cancer ([place]) 10/02/2021   S/P lobectomy of lung 10/02/2021   Arthropathy of lumbar facet joint 09/27/2021   Chronic low back pain 09/27/2021   Status post cervical spinal fusion 09/27/2021   Pseudoarthrosis of cervical spine ([place]) 09/27/2021   Cervical myofascial pain syndrome 09/24/2021   Lumbar back pain with radiculopathy affecting left lower extremity 09/24/2021   Lung mass 09/05/2021   Adenopathy 09/05/2021   Vitamin D deficiency 03/28/2021   Hypercholesterolemia 12/01/2019   Abdominal bloating 11/07/2019    PCP: Dibas Koirala, MD  REFERRING PROVIDER: Newman Pies, MD   REFERRING DIAG: M43.16 (ICD-10-CM) - Spondylolisthesis, lumbar region   Rationale for Evaluation and Treatment Rehabilitation  THERAPY DIAG:  Radiculopathy, lumbar region  Other low back pain  Muscle weakness (generalized)  Cramp and spasm  Difficulty in walking, not elsewhere classified  ONSET DATE: 10 years ago  SUBJECTIVE:  SUBJECTIVE STATEMENT: Pt reports she started back to work half days and is tolerating this ok.  Her pain level is 3/10 today.  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 Yes NPRS scale: 7/10 Pain location: bil LBP and bil LE pain/weakness/numbness Lt>Rt Pain orientation: Bilateral  PAIN TYPE: burning and shooting, weakness, leg gets stuck and can't move Pain description: constant, tingling, and pins/needles   Aggravating factors:  standing, walking - varies for tolerance Relieving factors: sitting, ibuprofen in AM 800mg , laying down on side pillow b/w legs   PRECAUTIONS: None  WEIGHT BEARING RESTRICTIONS: No  FALLS:  Has patient fallen in last 6 months? No  LIVING ENVIRONMENT: Lives with: lives with their family Lives in: House/apartment Stairs: No Has following equipment at home: grab bar in shower with handheld shower head  OCCUPATION: medical assistant, full time  PLOF: Independent  PATIENT GOALS: have less pain   OBJECTIVE:   DIAGNOSTIC FINDINGS:  09/2021 LUMBAR MRI IMPRESSION:  This MRI of the lumbar spine without contrast shows the following: At L3-L4, there is moderate spinal stenosis due to degenerative changes including severe facet hypertrophy.  There is moderate lateral recess stenosis though there does not appear to be nerve root compression. At L4-L5, there is severe spinal stenosis due to disc protrusion, minimal anterolisthesis and severe facet hypertrophy.  This also causes left greater than right lateral recess stenosis which could affect the left L5 nerve root. Degenerative changes at T11-T12, L2-L3 and L5-S1 do not lead to spinal stenosis or nerve root compression.  PATIENT SURVEYS:  FOTO 47% goal 55%  SCREENING FOR RED FLAGS: Bowel or bladder incontinence: No Spinal tumors: No Cauda equina syndrome: No Compression fracture: No Abdominal aneurysm: No  COGNITION: Overall cognitive status: Within functional limits for tasks assessed     SENSATION: Bil LE numbness, pins/needles/weakness  MUSCLE LENGTH: Hamstrings: Right 50 deg; Left 50 deg Limited end range gluteals, piriformis  POSTURE: decreased thoracic kyphosis  PALPATION: Diffuse tenderness along lumbar, bil hips and thighs  LUMBAR ROM:   AROM eval  Flexion Fingers to toes  Extension 10, pain  Right lateral flexion full  Left lateral flexion full  Right rotation full  Left rotation full   (Blank rows = not  tested)  LOWER EXTREMITY STRENGTH:     MMT Right eval Left eval  Hip flexion 3+ 3+  Hip extension 4 4  Hip abduction 3+ 3+  Hip adduction 4+ 4+  Hip internal rotation 4+ 4+  Hip external rotation 4 4  Knee flexion 4+ 4+  Knee extension 4- 4-  Ankle dorsiflexion 4 4  Ankle plantarflexion 4 4  Ankle inversion 4 4  Ankle eversion 4 4   (Blank rows = not tested)  LOWER EXTREMITY ROM:    PASSIVE Right eval Left eval  Hip flexion 95 95  Hip extension    Hip abduction    Hip adduction    Hip internal rotation    Hip external rotation End range limited End range limited  Knee flexion    Knee extension    Ankle dorsiflexion    Ankle plantarflexion    Ankle inversion    Ankle eversion     (Blank rows = not tested)  LUMBAR SPECIAL TESTS:  Straight leg raise test: Positive bil  FUNCTIONAL  TESTS:  5 times sit to stand: 17 sec no hands  GAIT: Distance walked: 50 Assistive device utilized: None Level of assistance: Complete Independence Comments: no signif gait deviations  TODAY'S TREATMENT:                                                                                                                               DATE: 11/28/21 Nustep x 5 min level 2  Reviewed HEP: patient needed tactile cues and verbal cues to attain correct pelvic tilt PPT with 90/90 heel tap x 20 (added to HEP and printed for patient) PPT with dying bug x 20  (added to HEP and printed for patient) Supine hamstring stretch x 30 sec one each side Supine IT band stretch x 30   DATE: 11/20/21 (EVAL):  Initated HEP  PATIENT EDUCATION:  Education details: Access Code: 11/28/21 educated patient on spondylolisthesis Person educated: Patient Education method: Consulting civil engineer, Demonstration, Verbal cues, and Handouts Education comprehension: verbalized understanding and returned demonstration  HOME EXERCISE PROGRAM: Access Code: [email or web address] URL: [email or web address] Date:  11/28/2021 Prepared by: Candyce Churn  Exercises - Supine Posterior Pelvic Tilt  - 2 x daily - 7 x weekly - 3 sets - 10 reps - 5 hold - Supine Lower Trunk Rotation  - 2 x daily - 7 x weekly - 1 sets - 5 reps - 10 hold - Supine Double Knee to Chest  - 2 x daily - 7 x weekly - 1 sets - 5 reps - 10 hold - Supine Piriformis Stretch with Foot on Ground  - 2 x daily - 7 x weekly - 1 sets - 3 reps - 20 hold - Supine Figure 4 Piriformis Stretch  - 2 x daily - 7 x weekly - 1 sets - 3 reps - 20 hold - Supine 90/90 Alternating Heel Touches with Posterior Pelvic Tilt  - 1 x daily - 7 x weekly - 2 sets - 10 reps - Supine Dead Bug with Leg Extension  - 1 x daily - 7 x weekly - 2 sets - 10 repsAccess Code: [email or web address] URL: [email or web address] Date: 11/20/2021 Prepared by: Venetia Night Beuhring  ASSESSMENT:  CLINICAL IMPRESSION: Anarie needed verbal and tactile cues for attaining proper pelvic tilt.  She was then able to perform pelvic tilt consistently with all core work.  She had no pain with progressing core work.  She returns understanding of precautions for spondylolisthesis with regard to excessive extension.    Pt will benefit from skilled PT to address pain and deficits to maximize function.  OBJECTIVE IMPAIRMENTS: Abnormal gait, decreased activity tolerance, decreased balance, decreased coordination, decreased endurance, decreased mobility, difficulty walking, decreased ROM, decreased strength, hypomobility, increased muscle spasms, impaired flexibility, impaired sensation, impaired tone, improper body mechanics, postural dysfunction, and pain.   ACTIVITY LIMITATIONS: carrying, lifting, bending, standing, squatting, stairs, transfers, bathing, toileting, dressing, and locomotion level  PARTICIPATION LIMITATIONS: meal prep, cleaning, laundry, shopping, community activity, and yard work  PERSONAL FACTORS:  Time since onset of injury/illness/exacerbation and 1 comorbidity: recent diagnosis of  lung cancer  are also affecting patient's functional outcome.   REHAB POTENTIAL: Good  CLINICAL DECISION MAKING: Stable/uncomplicated  EVALUATION COMPLEXITY: Low   GOALS: Goals reviewed with patient? Yes  SHORT TERM GOALS: Target date: 12/26/2021  Pt will be ind with initial HEP without exacerbation of symptoms. Baseline: Goal status: INITIAL  2.  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 Baseline:  Goal status: INITIAL  3.  Pt will report at least 20% reduction in pain throughout work day. Baseline:  Goal status: INITIAL    LONG TERM GOALS: Target date: 01/23/2022  Pt will be ind with advanced HEP and be educated on options for conservative management of symptoms. Baseline:  Goal status: INITIAL  2.  Improve FOTO score to 55% or greater to demo improved function. Baseline: 47% Goal status: INITIAL  3.  Pt will report pain not to exceed 3/10 with work day. Baseline:  Goal status: INITIAL  4.  Pt will improve bil LE strength to at least 4+/5 for all muscle groups to improve endurance for housework and occupation. Baseline:  Goal status: INITIAL    PLAN:  PT FREQUENCY: 1-2x/week  PT DURATION: 8 weeks  PLANNED INTERVENTIONS: Therapeutic exercises, Therapeutic activity, Neuromuscular re-education, Balance training, Gait training, Patient/Family education, Self Care, Joint mobilization, Aquatic Therapy, Dry Needling, Electrical stimulation, Spinal mobilization, Cryotherapy, Moist heat, Taping, Traction, and Manual therapy.  PLAN FOR NEXT SESSION: NuStep, review HEP and progress lumbar stabilization  Kalasia Crafton B. Teddy Pena, PT 11/28/21 3:31 PM   [facility] [address] [place] [address] [place], [place] [address] Phone # [phone] Fax [phone]

## 2021-12-05 ENCOUNTER — Ambulatory Visit: Payer: 59 | Admitting: Physical Therapy

## 2021-12-05 ENCOUNTER — Encounter: Payer: Self-pay | Admitting: Physical Therapy

## 2021-12-05 DIAGNOSIS — M6281 Muscle weakness (generalized): Secondary | ICD-10-CM

## 2021-12-05 DIAGNOSIS — R252 Cramp and spasm: Secondary | ICD-10-CM

## 2021-12-05 DIAGNOSIS — M5416 Radiculopathy, lumbar region: Secondary | ICD-10-CM

## 2021-12-05 DIAGNOSIS — M5459 Other low back pain: Secondary | ICD-10-CM

## 2021-12-05 DIAGNOSIS — R262 Difficulty in walking, not elsewhere classified: Secondary | ICD-10-CM

## 2021-12-05 NOTE — Therapy (Addendum)
OUTPATIENT PHYSICAL THERAPY THORACOLUMBAR TREATMENT NOTE   Patient Name: JAILEIGH WEIMER MRN: [id number] DOB:July 09, 1960, 61 y.o., female Today's Date: 12/05/2021   PT End of Session - 12/05/21 1406     Visit Number 3    Date for PT Re-Evaluation 01/15/22    Authorization Type U[employer]    Authorization - Number of Visits 60    Progress Note Due on Visit 10    PT Start Time 1402    PT Stop Time 1440    PT Time Calculation (min) 38 min    Activity Tolerance Patient tolerated treatment well    Behavior During Therapy WFL for tasks assessed/performed              Past Medical History:  Diagnosis Date   Arthritis    COPD (chronic obstructive pulmonary disease) ([place])    Deep vein thrombophlebitis of leg ([place])    H/O: Bell's palsy    History of Anemia    during pregnancy   Hypertension    Lung cancer ([place]) 2023   Pneumonia    Past Surgical History:  Procedure Laterality Date   BLADDER SUSPENSION     BRONCHIAL BIOPSY  09/10/2021   Procedure: BRONCHIAL BIOPSIES;  Surgeon: Garner Nash, DO;  Location: [facility];  Service: Pulmonary;;   BRONCHIAL NEEDLE ASPIRATION BIOPSY  09/10/2021   Procedure: BRONCHIAL NEEDLE ASPIRATION BIOPSIES;  Surgeon: Garner Nash, DO;  Location: [facility];  Service: Pulmonary;;   CARPAL TUNNEL RELEASE Bilateral    FINE NEEDLE ASPIRATION  09/10/2021   Procedure: FINE NEEDLE ASPIRATION;  Surgeon: Garner Nash, DO;  Location: [facility];  Service: Pulmonary;;   INTERCOSTAL NERVE BLOCK Right 10/02/2021   Procedure: INTERCOSTAL NERVE BLOCK;  Surgeon: Lajuana Matte, MD;  Location: [facility];  Service: Thoracic;  Laterality: Right;   neck fusion     x4   NODE DISSECTION Right 10/02/2021   Procedure: NODE DISSECTION;  Surgeon: Lajuana Matte, MD;  Location: [facility];  Service: Thoracic;  Laterality: Right;   VIDEO BRONCHOSCOPY WITH ENDOBRONCHIAL ULTRASOUND Bilateral 09/10/2021   Procedure: VIDEO BRONCHOSCOPY WITH ENDOBRONCHIAL ULTRASOUND;   Surgeon: Garner Nash, DO;  Location: [facility];  Service: Pulmonary;  Laterality: Bilateral;   Patient Active Problem List   Diagnosis Date Noted   Lung cancer ([place]) 10/02/2021   S/P lobectomy of lung 10/02/2021   Arthropathy of lumbar facet joint 09/27/2021   Chronic low back pain 09/27/2021   Status post cervical spinal fusion 09/27/2021   Pseudoarthrosis of cervical spine ([place]) 09/27/2021   Cervical myofascial pain syndrome 09/24/2021   Lumbar back pain with radiculopathy affecting left lower extremity 09/24/2021   Lung mass 09/05/2021   Adenopathy 09/05/2021   Vitamin D deficiency 03/28/2021   Hypercholesterolemia 12/01/2019   Abdominal bloating 11/07/2019    PCP: Dibas Koirala, MD  REFERRING PROVIDER: Newman Pies, MD   REFERRING DIAG: M43.16 (ICD-10-CM) - Spondylolisthesis, lumbar region   Rationale for Evaluation and Treatment Rehabilitation  THERAPY DIAG:  Radiculopathy, lumbar region  Other low back pain  Muscle weakness (generalized)  Cramp and spasm  Difficulty in walking, not elsewhere classified  ONSET DATE: 10 years ago  SUBJECTIVE:  SUBJECTIVE STATEMENT: I am doing my HEP most days of the week.  I haven't been too busy at work today so pain isn't awful.  3/10.  I want some exercises I can do while sitting or standing at work.  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 Yes NPRS scale: 3/10 Pain location: bil LBP and bil LE pain/weakness/numbness Lt>Rt Pain orientation: Bilateral  PAIN TYPE: burning and shooting, weakness, leg gets stuck and can't move Pain description: constant, tingling, and  pins/needles   Aggravating factors: standing, walking - varies for tolerance Relieving factors: sitting, ibuprofen in AM 835m, laying down on side pillow b/w legs   PRECAUTIONS: None  WEIGHT BEARING RESTRICTIONS: No  FALLS:  Has patient fallen in last 6 months? No  LIVING ENVIRONMENT: Lives with: lives with their family Lives in: House/apartment Stairs: No Has following equipment at home: grab bar in shower with handheld shower head  OCCUPATION: medical assistant, full time  PLOF: Independent  PATIENT GOALS: have less pain   OBJECTIVE:   DIAGNOSTIC FINDINGS:  09/2021 LUMBAR MRI IMPRESSION:  This MRI of the lumbar spine without contrast shows the following: At L3-L4, there is moderate spinal stenosis due to degenerative changes including severe facet hypertrophy.  There is moderate lateral recess stenosis though there does not appear to be nerve root compression. At L4-L5, there is severe spinal stenosis due to disc protrusion, minimal anterolisthesis and severe facet hypertrophy.  This also causes left greater than right lateral recess stenosis which could affect the left L5 nerve root. Degenerative changes at T11-T12, L2-L3 and L5-S1 do not lead to spinal stenosis or nerve root compression.  PATIENT SURVEYS:  FOTO 47% goal 55%  SCREENING FOR RED FLAGS: Bowel or bladder incontinence: No Spinal tumors: No Cauda equina syndrome: No Compression fracture: No Abdominal aneurysm: No  COGNITION: Overall cognitive status: Within functional limits for tasks assessed     SENSATION: Bil LE numbness, pins/needles/weakness  MUSCLE LENGTH: Hamstrings: Right 50 deg; Left 50 deg Limited end range gluteals, piriformis  POSTURE: decreased thoracic kyphosis  PALPATION: Diffuse tenderness along lumbar, bil hips and thighs  LUMBAR ROM:   AROM eval  Flexion Fingers to toes  Extension 10, pain  Right lateral flexion full  Left lateral flexion full  Right rotation full   Left rotation full   (Blank rows = not tested)  LOWER EXTREMITY STRENGTH:     MMT Right eval Left eval  Hip flexion 3+ 3+  Hip extension 4 4  Hip abduction 3+ 3+  Hip adduction 4+ 4+  Hip internal rotation 4+ 4+  Hip external rotation 4 4  Knee flexion 4+ 4+  Knee extension 4- 4-  Ankle dorsiflexion 4 4  Ankle plantarflexion 4 4  Ankle inversion 4 4  Ankle eversion 4 4   (Blank rows = not tested)  LOWER EXTREMITY ROM:    PASSIVE Right eval Left eval  Hip flexion 95 95  Hip extension    Hip abduction    Hip adduction    Hip internal rotation    Hip external rotation End range limited End range limited  Knee flexion    Knee extension    Ankle dorsiflexion    Ankle plantarflexion    Ankle inversion    Ankle eversion     (Blank rows = not tested)  LUMBAR SPECIAL TESTS:  Straight leg raise test: Positive bil  FUNCTIONAL TESTS:  5 times sit to stand: 17 sec no hands  GAIT: Distance walked: 50 Assistive device utilized: None Level of assistance: Complete Independence Comments: no signif gait deviations  TODAY'S TREATMENT:                                                                                                                               DATE: 12/05/21 Nustep x 5 min level 3 Countertop therex: bil heel raise, hip abd, hip ext, 1/2 squat x 10 each Seated alt march, VC to keep shoulders level x 20, connect march to core indraw Seated LAQ 10x5" Sit to stand 1x5, squat to chair 1x5 Wall push ups 2x5 Supine pelvic tilt 10x3" Articulating bridge x5 Supine HS stretch and ITB stretch with strap 1x30" each bil Supine piriformis stretch pull across and push away fig 4 2x20" each bil PPT with march to leg lowering x10  PPT with dying bug x 20   DATE: 11/28/21 Nustep x 5 min level 2  Reviewed HEP: patient needed tactile cues and verbal cues to attain correct pelvic tilt PPT with 90/90 heel tap x 20 (added to HEP and printed for patient) PPT with dying bug  x 20  (added to HEP and printed for patient) Supine hamstring stretch x 30 sec one each side Supine IT band stretch x 30   DATE: 11/20/21 (EVAL):  Initated HEP  PATIENT EDUCATION:  Education details: Access Code: 11/28/21 educated patient on spondylolisthesis Person educated: Patient Education method: Consulting civil engineer, Demonstration, Verbal cues, and Handouts Education comprehension: verbalized understanding and returned demonstration  HOME EXERCISE PROGRAM: Access Code: [email or web address] URL: [email or web address] Date: 12/05/2021 Prepared by: Venetia Night Zaydrian Batta  Exercises - Supine Posterior Pelvic Tilt  - 2 x daily - 7 x weekly - 3 sets - 10 reps - 5 hold - Supine Lower Trunk Rotation  - 2 x daily - 7 x weekly - 1 sets - 5 reps - 10 hold - Supine Double Knee to Chest  - 2 x daily - 7 x weekly - 1 sets - 5 reps - 10 hold - Supine Piriformis Stretch with Foot on Ground  - 2 x daily - 7 x weekly - 1 sets - 3 reps - 20 hold - Supine Figure 4 Piriformis Stretch  - 2 x daily - 7 x weekly - 1 sets - 3 reps - 20 hold - Supine 90/90 Alternating Heel Touches with Posterior Pelvic Tilt  - 1 x daily - 7 x weekly - 2 sets - 10 reps - Supine Dead Bug with Leg Extension  - 1 x daily - 7 x weekly - 2 sets - 10 reps - Heel Raises with Counter Support  - 1 x daily - 7 x weekly - 2 sets - 10 reps - Standing Hip Abduction with Counter Support  - 1 x daily - 7 x weekly - 2 sets - 10 reps -  Standing Hip Extension with Counter Support  - 1 x daily - 7 x weekly - 2 sets - 10 reps - Squat with Counter Support  - 1 x daily - 7 x weekly - 2 sets - 10 reps - Seated Long Arc Quad  - 1 x daily - 7 x weekly - 2 sets - 10 reps - 5 hold - Sit to Stand  - 1 x daily - 7 x weekly - 2 sets - 10 reps - Wall Push Up  - 1 x daily - 7 x weekly - 2 sets - 10 reps  ASSESSMENT:  CLINICAL IMPRESSION: Pt has gone back to work and grows tired with prolonged standing.  Pain on arrival was 3/10 and after therex above, pain had  reduced to 1-2/10.  She wanted therex she could do at work in standing or sitting so progressed LE strength accordingly with good tolerance.  She has trouble maintaining core control with supine dying bug and 90/90 toe taps so modified excursion of exercise.  OBJECTIVE IMPAIRMENTS: Abnormal gait, decreased activity tolerance, decreased balance, decreased coordination, decreased endurance, decreased mobility, difficulty walking, decreased ROM, decreased strength, hypomobility, increased muscle spasms, impaired flexibility, impaired sensation, impaired tone, improper body mechanics, postural dysfunction, and pain.   ACTIVITY LIMITATIONS: carrying, lifting, bending, standing, squatting, stairs, transfers, bathing, toileting, dressing, and locomotion level  PARTICIPATION LIMITATIONS: meal prep, cleaning, laundry, shopping, community activity, and yard work  PERSONAL FACTORS: Time since onset of injury/illness/exacerbation and 1 comorbidity: recent diagnosis of lung cancer  are also affecting patient's functional outcome.   REHAB POTENTIAL: Good  CLINICAL DECISION MAKING: Stable/uncomplicated  EVALUATION COMPLEXITY: Low   GOALS: Goals reviewed with patient? Yes  SHORT TERM GOALS: Target date: 01/02/2022  Pt will be ind with initial HEP without exacerbation of symptoms. Baseline: Goal status: INITIAL  2.  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 Baseline:  Goal status: INITIAL  3.  Pt will report at least 20% reduction in pain throughout work day. Baseline:  Goal status: INITIAL    LONG TERM GOALS: Target date: 01/30/2022  Pt will be ind with advanced HEP and be educated on options for conservative management of symptoms. Baseline:  Goal status: INITIAL  2.  Improve FOTO score to 55% or greater to demo improved function. Baseline: 47% Goal status: INITIAL  3.  Pt will report pain not to exceed  3/10 with work day. Baseline:  Goal status: INITIAL  4.  Pt will improve bil LE strength to at least 4+/5 for all muscle groups to improve endurance for housework and occupation. Baseline:  Goal status: INITIAL    PLAN:  PT FREQUENCY: 1-2x/week  PT DURATION: 8 weeks  PLANNED INTERVENTIONS: Therapeutic exercises, Therapeutic activity, Neuromuscular re-education, Balance training, Gait training, Patient/Family education, Self Care, Joint mobilization, Aquatic Therapy, Dry Needling, Electrical stimulation, Spinal mobilization, Cryotherapy, Moist heat, Taping, Traction, and Manual therapy.  PLAN FOR NEXT SESSION: NuStep, review HEP and progress lumbar stabilization  Baruch Merl, PT 12/05/21 2:43 PM    [address], [place], [place] [address] Phone # [phone] Fax [phone]   PHYSICAL THERAPY DISCHARGE SUMMARY  Visits from Start of Care: 3  Current functional level related to goals / functional outcomes: Pt reported improved symptoms within first several appts and was a no show at last scheduled appt.  Did not make any further appts and  is now outside cert period so d/c at this time.   Remaining deficits: See above   Education / Equipment: HEP   Patient agrees to discharge. Patient goals were partially met. Patient is being discharged due to not returning since the last visit.  Luna Audia, PT 01/29/22 10:43 AM

## 2021-12-11 ENCOUNTER — Ambulatory Visit: Payer: 59

## 2021-12-26 ENCOUNTER — Encounter: Payer: Self-pay | Admitting: Pulmonary Disease

## 2021-12-26 ENCOUNTER — Ambulatory Visit (INDEPENDENT_AMBULATORY_CARE_PROVIDER_SITE_OTHER): Payer: 59 | Admitting: Pulmonary Disease

## 2021-12-26 VITALS — BP 124/66 | HR 87 | Temp 98.1°F | Ht 67.0 in | Wt 199.6 lb

## 2021-12-26 DIAGNOSIS — J069 Acute upper respiratory infection, unspecified: Secondary | ICD-10-CM

## 2021-12-26 DIAGNOSIS — C3490 Malignant neoplasm of unspecified part of unspecified bronchus or lung: Secondary | ICD-10-CM | POA: Diagnosis not present

## 2021-12-26 MED ORDER — PREDNISONE 20 MG PO TABS: 20.0000 mg | ORAL_TABLET | Freq: Every day | ORAL | 0 refills | Status: AC

## 2021-12-26 NOTE — Patient Instructions (Signed)
Nice to meet you  With the nasal congestion, sore throat, and congestion in the chest, I am most worried about a viral illness.  It is common for viruses affecting multiple parts of the body.  All these parts are connected.  To help speed recovery, I recommend taking prednisone 20 mg once a day for 5 days.  It is okay to continue or finish out the azithromycin.  Your lungs sound very clear today, this is reassuring.  If that sensation of things getting stuck when you swallow or eat continues, we may need to consider having you see a GI doctor to investigate the esophagus as a possible cause of the symptoms.    Return to clinic as needed, please contact us early next week if things are not improving.

## 2021-12-26 NOTE — Progress Notes (Signed)
@Patient  ID: Anna Roth, female    DOB: 08-31-1960, 61 y.o.   MRN: [id number]  Chief Complaint  Patient presents with   Acute Visit    Pt is here for an acute visit. She states starting Sunday she started feeling congested and that she feels like something is stuck in her chest. Covid and strep was neg on Monday. Pt states she feels like she has a lot of mucus in her chest and chills are noted. She states that she feels like she having a sore throat as well. Just started zpack and she is on day 2, but she does not feel like it is helping.     Referring provider: Lujean Amel, MD  HPI:   61 y.o. woman previously seen in pulmonary clinic 2023 with right upper lobe nodule subsequently diagnosed with squamous cell on bronchoscopy status post surgical resection 09/2021 presenting for acute visit with sore throat.  Most recent pulmonary note x2 reviewed.  Discharge summary 09/2021 reviewed.  Oncology note 10/2021 reviewed.  Cardiothoracic outpatient note 10/16/2021 and 11/15/2021 reviewed.  Patient reports onset of sore throat 4 days prior, Sunday.  Today is Thursday.  Monday things were a little bit worse.  She was able to work.  Woke up Tuesday and voice was hoarse, mostly laryngitis.  Difficult to phonate.  She went to work but was sent home.  She contacted her PCP and it sounds like a Z-Pak was sent in on Tuesday.  She states she took the first dose Tuesday, next dose Wednesday, additional dose today.  Slight improvement overall but usually after 2 doses of Z-Pak it "knocks it out."  This prompted visit today.  She describes that she had nasal congestion.  Some congestion in her chest.  For further clarification this congestion has been going on for 3 weeks.  On further questioning it sounds like more or less description of dysphagia.  When she eats or drinks she feels like something gets stuck in the middle of her chest.  Sometimes she will take Alka-Seltzer or drink carbonated beverage and that  sensation improved.  This is with solids and liquids.  She has ongoing cough associated with current symptoms.  Most recent CT scans in the past 09/15/2021 and 10/16/2021 demonstrate right upper lobe irregular nodular opacity greater than 1 cm, mild emphysematous changes, otherwise clear lungs.  Questionaires / Pulmonary Flowsheets:   ACT:      No data to display          MMRC:     No data to display          Epworth:      No data to display          Tests:   FENO:  No results found for: "NITRICOXIDE"  PFT:    Latest Ref Rng & Units 09/12/2021    3:53 PM  PFT Results  FVC-Pre L 2.25   FVC-Predicted Pre % 61   FVC-Post L 2.37   FVC-Predicted Post % 64   Pre FEV1/FVC % % 77   Post FEV1/FCV % % 75   FEV1-Pre L 1.72   FEV1-Predicted Pre % 61   FEV1-Post L 1.77   DLCO uncorrected ml/min/mmHg 20.08   DLCO UNC% % 90   DLCO corrected ml/min/mmHg 19.10   DLCO COR %Predicted % 85   DLVA Predicted % 108   TLC L 4.44   TLC % Predicted % 80   RV % Predicted % 84  Personally reviewed and interpreted as spirometry suggestive of moderate restriction versus air trapping.  Lung volumes within the limits.  DLCO within normal limits.  WALK:      No data to display          Imaging: No results found.  Lab Results:  CBC    Component Value Date/Time   WBC 10.5 11/07/2021 1356   WBC 13.5 (H) 10/03/2021 0427   RBC 4.95 11/07/2021 1356   HGB 13.6 11/07/2021 1356   HGB 15.4 05/27/2016 1026   HCT 41.4 11/07/2021 1356   HCT 46.7 (H) 05/27/2016 1026   PLT 211 11/07/2021 1356   PLT 248 05/27/2016 1026   MCV 83.6 11/07/2021 1356   MCV 82 05/27/2016 1026   MCH 27.5 11/07/2021 1356   MCHC 32.9 11/07/2021 1356   RDW 14.5 11/07/2021 1356   RDW 14.6 05/27/2016 1026   LYMPHSABS 4.4 (H) 11/07/2021 1356   MONOABS 0.8 11/07/2021 1356   EOSABS 0.1 11/07/2021 1356   BASOSABS 0.1 11/07/2021 1356    BMET    Component Value Date/Time   NA 138 11/07/2021 1356   NA  140 05/27/2016 1026   K 4.1 11/07/2021 1356   CL 102 11/07/2021 1356   CO2 29 11/07/2021 1356   GLUCOSE 106 (H) 11/07/2021 1356   BUN 12 11/07/2021 1356   BUN 9 05/27/2016 1026   CREATININE 0.74 11/07/2021 1356   CALCIUM 9.5 11/07/2021 1356   GFRNONAA >60 11/07/2021 1356   GFRAA >60 04/11/2019 1046    BNP No results found for: "BNP"  ProBNP No results found for: "PROBNP"  Specialty Problems       Pulmonary Problems   Lung mass   Lung cancer ([place])    No Known Allergies  Immunization History  Administered Date(s) Administered   Hepatitis B 05/06/2016   Hepatitis B, adult 06/04/2016   Influenza,inj,Quad PF,6+ Mos 05/06/2016   Influenza,inj,quad, With Preservative 11/26/2018   Moderna Sars-Covid-2 Vaccination 05/12/2019, 06/24/2019   Tdap 03/27/2012    Past Medical History:  Diagnosis Date   Arthritis    COPD (chronic obstructive pulmonary disease) ([place])    Deep vein thrombophlebitis of leg ([place])    H/O: Bell's palsy    History of Anemia    during pregnancy   Hypertension    Lung cancer ([place]) 2023   Pneumonia     Tobacco History: Social History   Tobacco Use  Smoking Status Former   Packs/day: 0.50   Years: 42.00   Total pack years: 21.00   Types: Cigarettes   Start date: 64   Quit date: 09/26/2021   Years since quitting: 0.2  Smokeless Tobacco Never   Counseling given: Not Answered   Continue to not smoke  Outpatient Encounter Medications as of 12/26/2021  Medication Sig   azithromycin (ZITHROMAX) 250 MG tablet TAKE 2 TABLETS (500 MG) BY ORAL ROUTE ONCE DAILY FOR 1 DAY THEN 1 TABLET (250 MG) BY ORAL ROUTE ONCE DAILY FOR 4 DAYS   clobetasol ointment (TEMOVATE) 5.32 % Apply 1 Application topically 2 (two) times daily as needed (rash/irritation.).   Dupilumab (DUPIXENT) 300 MG/2ML SOPN Inject 300 mg into the skin every 14 (fourteen) days.   gabapentin (NEURONTIN) 300 MG capsule Take 1 capsule (300 mg total) by mouth 3 (three) times daily as  needed. May take 600mg  at bedtime if needed.   ibuprofen (ADVIL) 200 MG tablet Take 200 mg by mouth every 8 (eight) hours as needed (pain.).   predniSONE (DELTASONE) 20  MG tablet Take 1 tablet (20 mg total) by mouth daily with breakfast for 5 days.   No facility-administered encounter medications on file as of 12/26/2021.     Review of Systems  Review of Systems  No chest pain with exertion.  No orthopnea or PND.  Comprehensive review of systems otherwise negative. Physical Exam  BP 124/66 (BP Location: Left Arm, Patient Position: Sitting, Cuff Size: Normal)   Pulse 87   Temp 98.1 F (36.7 C) (Oral)   Ht 5\' 7"  (1.702 m)   Wt 199 lb 9.6 oz (90.5 kg)   SpO2 97%   BMI 31.26 kg/m   Wt Readings from Last 5 Encounters:  12/26/21 199 lb 9.6 oz (90.5 kg)  11/15/21 197 lb (89.4 kg)  11/07/21 194 lb 6 oz (88.2 kg)  10/11/21 193 lb (87.5 kg)  10/07/21 195 lb 8 oz (88.7 kg)    BMI Readings from Last 5 Encounters:  12/26/21 31.26 kg/m  11/15/21 30.85 kg/m  11/07/21 30.44 kg/m  10/11/21 30.23 kg/m  10/07/21 30.62 kg/m     Physical Exam General: Sitting in chair, no acute distress Eyes: EOMI, no icterus Neck: Supple, no JVP Pulmonary: Clear, distant, normal work of breathing Cardiovascular: Warm, no edema Abdomen: Nondistended, sounds present MSK: There is no muscular no joint fusion Neuro: Normal gait, no weakness Psych: Normal mood, full affect  Assessment & Plan:   Sore throat, nasal congestion, congestion: Present for going from 4 to 5 days.  She describes gradual improvement as she was hoarse, worsening symptoms Tuesday now improved on Thursday.  She has not appreciated significant improvement however with azithromycin.  This is not surprising given her description sound like a viral illness given nasal/sinus, pharyngeal, and possible bronchial component of disease consistent with viral infection as opposed to bacterial infection.  Advised her that this likely needs  to run its course.  Chest sounds are clear.  No concern for pneumonia.  Her description of chest congestion sounds more like esophagitis/gastritis versus dysphagia.  Advised her to follow-up with GI if symptoms continue.  Prednisone 20 mg daily prescribed to aid in recovery.  Lung cancer: Resected 09/2021 ongoing surveillance via oncology.  She reports good healing of wounds etc.  Feels fine from surgery.  Return if symptoms worsen or fail to improve.   Lanier Clam, MD 12/26/2021   This appointment required 61 minutes of patient care (this includes precharting, chart review, review of results, face-to-face care, etc.).

## 2022-01-02 ENCOUNTER — Other Ambulatory Visit: Payer: Self-pay | Admitting: Family Medicine

## 2022-01-02 DIAGNOSIS — G8929 Other chronic pain: Secondary | ICD-10-CM

## 2022-01-02 DIAGNOSIS — R1319 Other dysphagia: Secondary | ICD-10-CM

## 2022-03-25 ENCOUNTER — Ambulatory Visit
Admission: RE | Admit: 2022-03-25 | Discharge: 2022-03-25 | Disposition: A | Discharge status: Home / Self Care | Payer: 59 | Source: Ambulatory Visit | Attending: Family Medicine | Admitting: Family Medicine

## 2022-03-25 DIAGNOSIS — R1319 Other dysphagia: Secondary | ICD-10-CM

## 2022-03-25 DIAGNOSIS — G8929 Other chronic pain: Secondary | ICD-10-CM

## 2022-04-02 ENCOUNTER — Other Ambulatory Visit (HOSPITAL_COMMUNITY): Payer: Self-pay | Admitting: Family Medicine

## 2022-04-02 DIAGNOSIS — E7800 Pure hypercholesterolemia, unspecified: Secondary | ICD-10-CM

## 2022-04-14 ENCOUNTER — Telehealth: Payer: Self-pay | Admitting: Internal Medicine

## 2022-04-14 NOTE — Telephone Encounter (Signed)
Called patient regarding 03/18 scheduling message, left a voicemail.

## 2022-04-17 ENCOUNTER — Ambulatory Visit (HOSPITAL_COMMUNITY)
Admission: RE | Admit: 2022-04-17 | Discharge: 2022-04-17 | Disposition: A | Discharge status: Home / Self Care | Payer: 59 | Source: Ambulatory Visit | Attending: Family Medicine | Admitting: Family Medicine

## 2022-04-17 DIAGNOSIS — E7800 Pure hypercholesterolemia, unspecified: Secondary | ICD-10-CM | POA: Insufficient documentation

## 2022-05-08 ENCOUNTER — Ambulatory Visit (HOSPITAL_COMMUNITY)
Admission: RE | Admit: 2022-05-08 | Discharge: 2022-05-08 | Disposition: A | Discharge status: Home / Self Care | Payer: 59 | Source: Ambulatory Visit | Attending: Internal Medicine | Admitting: Internal Medicine

## 2022-05-08 DIAGNOSIS — C3490 Malignant neoplasm of unspecified part of unspecified bronchus or lung: Secondary | ICD-10-CM | POA: Insufficient documentation

## 2022-05-08 MED ORDER — IOHEXOL 300 MG/ML  SOLN
75.0000 mL | Freq: Once | INTRAMUSCULAR | Status: AC | PRN
  Administered 2022-05-08: 75 mL via INTRAVENOUS

## 2022-05-08 MED ORDER — SODIUM CHLORIDE (PF) 0.9 % IJ SOLN
INTRAMUSCULAR | Status: AC
  Filled 2022-05-08: qty 50

## 2022-05-12 ENCOUNTER — Telehealth: Payer: Self-pay | Admitting: Internal Medicine

## 2022-05-12 NOTE — Progress Notes (Unsigned)
[facility] OFFICE PROGRESS NOTE  Koirala, Dibas, MD [address] [place] [address]  DIAGNOSIS: stage Ia (T1c, N0, M0) non-small cell lung cancer, squamous cell carcinoma presented with right upper lobe lung nodule. Diagnosed September 2023  PRIOR THERAPY: Status post right upper lobectomy with lymph node sampling under the care of Dr. Cliffton Asters on October 02, 2021.   CURRENT THERAPY: Observation   INTERVAL HISTORY: Anna Roth 62 y.o. female returns to the clinic today for a 80-month follow-up visit.  The patient was diagnosed with stage I non-small cell lung cancer in September 2023.  She underwent resection via lobectomy and has been on observation since that time.  She is feeling fairly well today. She didn't take her norvasc this morning and her blood pressure is a little elevated. She denies any fever, chills, night sweats, or unexplained weight loss. Denies any nausea, vomiting, diarrhea, or constipation. She may have a minor pain over her right ribs over her surgery site every "now and then" but this is overall getting better. She reports similar baseline intermittent dyspnea on exertion. She sometimes may have cough secondary to allergies and phlegm. She is not taking anything for allergies.  Denies any hemoptysis.  She quit smoking when she was diagnosed with cancer. She denies any headache or visual changes.  She recently had a restaging CT scan performed.  She is here today for evaluation and to review her scan results.  MEDICAL HISTORY: Past Medical History:  Diagnosis Date   Arthritis    COPD (chronic obstructive pulmonary disease) (HCC)    Deep vein thrombophlebitis of leg (HCC)    H/O: Bell's palsy    History of Anemia    during pregnancy   Hypertension    Lung cancer (HCC) 2023   Pneumonia     ALLERGIES:  has No Known Allergies.  MEDICATIONS:  Current Outpatient Medications  Medication Sig Dispense Refill   amLODipine (NORVASC)  2.5 MG tablet Take 2.5 mg by mouth daily.     gabapentin (NEURONTIN) 300 MG capsule Take 1 capsule (300 mg total) by mouth 3 (three) times daily as needed. May take  at bedtime if needed. 90 capsule 11   azithromycin (ZITHROMAX) 250 MG tablet TAKE 2 TABLETS (500 MG) BY ORAL ROUTE ONCE DAILY FOR 1 DAY THEN 1 TABLET (250 MG) BY ORAL ROUTE ONCE DAILY FOR 4 DAYS (Patient not taking: Reported on 05/13/2022)     clobetasol ointment (TEMOVATE) 0.05 % Apply 1 Application topically 2 (two) times daily as needed (rash/irritation.). (Patient not taking: Reported on 05/13/2022)     Dupilumab (DUPIXENT) 300 MG/2ML SOPN Inject 300 mg into the skin every 14 (fourteen) days. (Patient not taking: Reported on 05/13/2022)     ibuprofen (ADVIL) 200 MG tablet Take 200 mg by mouth every 8 (eight) hours as needed (pain.). (Patient not taking: Reported on 05/13/2022)     traMADol (ULTRAM) 50 MG tablet  (Patient not taking: Reported on 05/13/2022)     No current facility-administered medications for this visit.    SURGICAL HISTORY:  Past Surgical History:  Procedure Laterality Date   BLADDER SUSPENSION     BRONCHIAL BIOPSY  09/10/2021   Procedure: BRONCHIAL BIOPSIES;  Surgeon: Josephine Igo, DO;  Location: [facility];  Service: Pulmonary;;   BRONCHIAL NEEDLE ASPIRATION BIOPSY  09/10/2021   Procedure: BRONCHIAL NEEDLE ASPIRATION BIOPSIES;  Surgeon: Josephine Igo, DO;  Location: [facility];  Service: Pulmonary;;   CARPAL TUNNEL RELEASE  Bilateral    FINE NEEDLE ASPIRATION  09/10/2021   Procedure: FINE NEEDLE ASPIRATION;  Surgeon: Josephine Igo, DO;  Location: [facility];  Service: Pulmonary;;   INTERCOSTAL NERVE BLOCK Right 10/02/2021   Procedure: INTERCOSTAL NERVE BLOCK;  Surgeon: Corliss Skains, MD;  Location: [facility] OR;  Service: Thoracic;  Laterality: Right;   neck fusion     x4   NODE DISSECTION Right 10/02/2021   Procedure: NODE DISSECTION;  Surgeon: Corliss Skains, MD;  Location: [facility] OR;   Service: Thoracic;  Laterality: Right;   VIDEO BRONCHOSCOPY WITH ENDOBRONCHIAL ULTRASOUND Bilateral 09/10/2021   Procedure: VIDEO BRONCHOSCOPY WITH ENDOBRONCHIAL ULTRASOUND;  Surgeon: Josephine Igo, DO;  Location: [facility];  Service: Pulmonary;  Laterality: Bilateral;    REVIEW OF SYSTEMS:   Review of Systems  Constitutional: Negative for appetite change, chills, fatigue, fever and unexpected weight change.  HENT: Negative for mouth sores, nosebleeds, sore throat and trouble swallowing.   Eyes: Negative for eye problems and icterus.  Respiratory: Positive for mild dyspnea on exertion and cough. Negative for hemoptysis and wheezing.   Cardiovascular: Positive for occasional mild pain over the right rib surgical site. Negative for leg swelling.  Gastrointestinal: Negative for abdominal pain, constipation, diarrhea, nausea and vomiting.  Genitourinary: Negative for bladder incontinence, difficulty urinating, dysuria, frequency and hematuria.   Musculoskeletal: Negative for back pain, gait problem, neck pain and neck stiffness.  Skin: Negative for itching and rash.  Neurological: Negative for dizziness, extremity weakness, gait problem, headaches, light-headedness and seizures.  Hematological: Negative for adenopathy. Does not bruise/bleed easily.  Psychiatric/Behavioral: Negative for confusion, depression and sleep disturbance. The patient is not nervous/anxious.     PHYSICAL EXAMINATION:  Blood pressure (!) 147/79, pulse 67, temperature 98.5 F (36.9 C), temperature source Oral, resp. rate 16, weight 196 lb 1.6 oz (89 kg), SpO2 96 %.  ECOG PERFORMANCE STATUS: 0  Physical Exam  Constitutional: Oriented to person, place, and time and well-developed, well-nourished, and in no distress.  HENT:  Head: Normocephalic and atraumatic.  Mouth/Throat: Oropharynx is clear and moist. No oropharyngeal exudate.  Eyes: Conjunctivae are normal. Right eye exhibits no discharge. Left eye exhibits no  discharge. No scleral icterus.  Neck: Normal range of motion. Neck supple.  Cardiovascular: Normal rate, regular rhythm, normal heart sounds and intact distal pulses.   Pulmonary/Chest: Effort normal and breath sounds normal. No respiratory distress. No wheezes. No rales.  Abdominal: Soft. Bowel sounds are normal. Exhibits no distension and no mass. There is no tenderness.  Musculoskeletal: Normal range of motion. Exhibits no edema.  Lymphadenopathy:    No cervical adenopathy.  Neurological: Alert and oriented to person, place, and time. Exhibits normal muscle tone. Gait normal. Coordination normal.  Skin: Skin is warm and dry. No rash noted. Not diaphoretic. No erythema. No pallor.  Psychiatric: Mood, memory and judgment normal.  Vitals reviewed.  LABORATORY DATA: Lab Results  Component Value Date   WBC 9.4 05/13/2022   HGB 13.9 05/13/2022   HCT 41.7 05/13/2022   MCV 84.1 05/13/2022   PLT 236 05/13/2022      Chemistry      Component Value Date/Time   NA 138 11/07/2021 1356   NA 140 05/27/2016 1026   K 4.1 11/07/2021 1356   CL 102 11/07/2021 1356   CO2 29 11/07/2021 1356   BUN 12 11/07/2021 1356   BUN 9 05/27/2016 1026   CREATININE 0.74 11/07/2021 1356      Component Value Date/Time   CALCIUM  9.5 11/07/2021 1356   ALKPHOS 97 11/07/2021 1356   AST 15 11/07/2021 1356   ALT 13 11/07/2021 1356   BILITOT 0.6 11/07/2021 1356       RADIOGRAPHIC STUDIES:  CT Chest W Contrast  Result Date: 05/09/2022 CLINICAL DATA:  Non-small cell lung cancer; * Tracking Code: BO * EXAM: CT CHEST WITH CONTRAST TECHNIQUE: Multidetector CT imaging of the chest was performed during intravenous contrast administration. RADIATION DOSE REDUCTION: This exam was performed according to the departmental dose-optimization program which includes automated exposure control, adjustment of the mA and/or kV according to patient size and/or use of iterative reconstruction technique. CONTRAST:  75mL OMNIPAQUE  IOHEXOL 300 MG/ML  SOLN COMPARISON:  PET-CT dated September 12, 2021 FINDINGS: Cardiovascular: Normal heart size. No pericardial effusion. Normal caliber thoracic aorta with mild atherosclerotic disease. Mild coronary artery calcifications. Mediastinum/Nodes: Esophagus and thyroid are unremarkable. No enlarged lymph nodes seen in the chest. Lungs/Pleura: Postsurgical changes of interval right upper lobectomy. Remaining central airways are patent. No consolidation, pleural effusion or pneumothorax. Upper Abdomen: No acute abnormality. Musculoskeletal: No chest wall abnormality. No acute or significant osseous findings. IMPRESSION: 1. Postsurgical changes of interval right upper lobectomy. No evidence of recurrent or metastatic disease. 2. Aortic Atherosclerosis (ICD10-I70.0). Electronically Signed   By: Allegra Lai M.D.   On: 05/09/2022 17:45   CT CARDIAC SCORING (SELF PAY ONLY)  Addendum Date: 04/21/2022   ADDENDUM REPORT: 04/21/2022 22:48 EXAM: OVER-READ INTERPRETATION  CT CHEST The following report is an over-read performed by radiologist Dr. Elnoria Howard [facility] on 04/21/2022. This over-read does not include interpretation of cardiac or coronary anatomy or pathology. The cardiovascular interpretation by the cardiologist is attached. COMPARISON:  Chest CT dated 09/03/2021 FINDINGS: Clear lungs. Interval post right upper lobectomy changes. No lung nodules or pleural fluid. Thoracic spine degenerative changes. IMPRESSION: 1. No acute abnormality. 2. Interval post right upper lobectomy changes. Electronically Signed   By: Beckie Salts M.D.   On: 04/21/2022 22:48   Result Date: 04/21/2022 CLINICAL DATA:  Cardiovascular Disease Risk stratification EXAM: Coronary Calcium Score MEDICATIONS: MEDICATIONS None TECHNIQUE: A gated, non-contrast computed tomography scan of the heart was performed using 3mm slice thickness. Axial images were analyzed on a dedicated workstation. Calcium scoring of the  coronary arteries was performed using the Agatston method. FINDINGS: Coronary arteries: Normal origins. Coronary Calcium Score: Left main: 0 Left anterior descending artery: 0 Left circumflex artery: 0 Right coronary artery: 0 Total: 0 Pericardium: Normal. Ascending Aorta: Normal caliber. Non-cardiac: See separate report from [facility]. IMPRESSION: Coronary calcium score of 0 Agatston units. This suggests low risk for future cardiac events. RECOMMENDATIONS: Coronary artery calcium (CAC) score is a strong predictor of incident coronary heart disease (CHD) and provides predictive information beyond traditional risk factors. CAC scoring is reasonable to use in the decision to withhold, postpone, or initiate statin therapy in intermediate-risk or selected borderline-risk asymptomatic adults (age 19-75 years and LDL-C >=70 to <190 mg/dL) who do not have diabetes or established atherosclerotic cardiovascular disease (ASCVD).* In intermediate-risk (10-year ASCVD risk >=7.5% to <20%) adults or selected borderline-risk (10-year ASCVD risk >=5% to <7.5%) adults in whom a CAC score is measured for the purpose of making a treatment decision the following recommendations have been made: If CAC=0, it is reasonable to withhold statin therapy and reassess in 5 to 10 years, as long as higher risk conditions are absent (diabetes mellitus, family history of premature CHD in first degree relatives (males <55 years; females <65 years),  cigarette smoking, or LDL >=190 mg/dL). If CAC is 1 to 99, it is reasonable to initiate statin therapy for patients >=75 years of age. If CAC is >=100 or >=75th percentile, it is reasonable to initiate statin therapy at any age. Cardiology referral should be considered for patients with CAC scores >=400 or >=75th percentile. *2018 AHA/ACC/AACVPR/AAPA/ABC/ACPM/ADA/AGS/APhA/ASPC/NLA/PCNA Guideline on the Management of Blood Cholesterol: A Report of the American College of Cardiology/American  Heart Association Task Force on Clinical Practice Guidelines. J Am Coll Cardiol. 2019;73(24):3168-3209. Marca Ancona, MD Electronically Signed: By: Marca Ancona M.D. On: 04/17/2022 17:57     ASSESSMENT/PLAN:  This is a very pleasant 62 year old African-American female recently diagnosed with stage I (T1c, N0, M0) non-small cell lung cancer, squamous cell carcinoma.  She presented with a right upper lobe lung nodule status post right upper lobectomy with lymph node sampling under the care of Dr. Cliffton Asters on 10/02/2021.  She has been on observation since that time.  The patient recently had a restaging CT scan performed.  The patient was seen with Dr. Arbutus Ped today.  Dr. Arbutus Ped personally and independently reviewed the scan and discussed results with the patient today.   The scan did not show any evidence of disease progression.  We will see the patient back for follow-up visit in 6 months for evaluation and repeat CT scan of the chest at that time.  She will continue on observation.  She will take her antihypertensive upon returning home today.   The patient was advised to call immediately if she has any concerning symptoms in the interval. The patient voices understanding of current disease status and treatment options and is in agreement with the current care plan. All questions were answered. The patient knows to call the clinic with any problems, questions or concerns. We can certainly see the patient much sooner if necessary   Orders Placed This Encounter  Procedures   CT Chest W Contrast    Standing Status:   Future    Standing Expiration Date:   05/13/2023    Order Specific Question:   If indicated for the ordered procedure, I authorize the administration of contrast media per Radiology protocol    Answer:   Yes    Order Specific Question:   Does the patient have a contrast media/X-ray dye allergy?    Answer:   No    Order Specific Question:   Preferred imaging location?     Answer:   [facility]   CBC with Differential (Cancer Center Only)    Standing Status:   Future    Standing Expiration Date:   05/13/2023   CMP (Cancer Center only)    Standing Status:   Future    Standing Expiration Date:   05/13/2023      Johnette Abraham Ghazal Pevey, PA-C 05/13/22  ADDENDUM: Hematology/Oncology Attending:  I had a face-to-face encounter with the patient today.  I reviewed her record, lab, scan and recommended her care plan.  This is a very pleasant 62 years old African-American female with history of stage Ia non-small cell lung cancer, squamous cell carcinoma status post right upper lobectomy with lymph node sampling in September 2023.  The patient is currently on observation and she is feeling fine with no concerning complaints. She had repeat CT scan of the chest performed recently.  I personally and independently reviewed the scan images and discussed the results with the patient today. Her scan showed no concerning findings for disease recurrence or metastasis. I recommended  for her to continue on observation with repeat CT scan of the chest in 6 months. The patient was advised to call immediately if she has any other concerning symptoms in the interval. Disclaimer: This note was dictated with voice recognition software. Similar sounding words can inadvertently be transcribed and may be missed upon review. Lajuana Matte, MD

## 2022-05-12 NOTE — Telephone Encounter (Signed)
Contacted patient to scheduled appointments. Patient is aware of appointments that are scheduled.   

## 2022-05-13 ENCOUNTER — Inpatient Hospital Stay (HOSPITAL_BASED_OUTPATIENT_CLINIC_OR_DEPARTMENT_OTHER): Payer: 59 | Admitting: Physician Assistant

## 2022-05-13 ENCOUNTER — Inpatient Hospital Stay: Payer: 59 | Attending: Physician Assistant

## 2022-05-13 ENCOUNTER — Other Ambulatory Visit: Payer: Self-pay

## 2022-05-13 VITALS — BP 147/79 | HR 67 | Temp 98.5°F | Resp 16 | Wt 196.1 lb

## 2022-05-13 DIAGNOSIS — I10 Essential (primary) hypertension: Secondary | ICD-10-CM | POA: Insufficient documentation

## 2022-05-13 DIAGNOSIS — Z85118 Personal history of other malignant neoplasm of bronchus and lung: Secondary | ICD-10-CM | POA: Insufficient documentation

## 2022-05-13 DIAGNOSIS — Z87891 Personal history of nicotine dependence: Secondary | ICD-10-CM | POA: Diagnosis not present

## 2022-05-13 DIAGNOSIS — C3490 Malignant neoplasm of unspecified part of unspecified bronchus or lung: Secondary | ICD-10-CM | POA: Diagnosis not present

## 2022-05-13 DIAGNOSIS — Z902 Acquired absence of lung [part of]: Secondary | ICD-10-CM | POA: Diagnosis not present

## 2022-05-13 DIAGNOSIS — Z79899 Other long term (current) drug therapy: Secondary | ICD-10-CM | POA: Insufficient documentation

## 2022-05-13 LAB — CMP (CANCER CENTER ONLY)
ALT: 29 U/L (ref 0–44)
AST: 28 U/L (ref 15–41)
Albumin: 4.3 g/dL (ref 3.5–5.0)
Alkaline Phosphatase: 87 U/L (ref 38–126)
Anion gap: 9 (ref 5–15)
BUN: 19 mg/dL (ref 8–23)
CO2: 26 mmol/L (ref 22–32)
Calcium: 9.7 mg/dL (ref 8.9–10.3)
Chloride: 103 mmol/L (ref 98–111)
Creatinine: 0.86 mg/dL (ref 0.44–1.00)
GFR, Estimated: >60 mL/min (ref >60)
Glucose, Bld: 86 mg/dL (ref 70–99)
Potassium: 3.9 mmol/L (ref 3.5–5.1)
Sodium: 138 mmol/L (ref 135–145)
Total Bilirubin: 0.6 mg/dL (ref 0.3–1.2)
Total Protein: 8 g/dL (ref 6.5–8.1)

## 2022-05-13 LAB — CBC WITH DIFFERENTIAL (CANCER CENTER ONLY)
Abs Immature Granulocytes: 0.02 10*3/uL (ref 0.00–0.07)
Basophils Absolute: 0 10*3/uL (ref 0.0–0.1)
Basophils Relative: 0 %
Eosinophils Absolute: 0 10*3/uL (ref 0.0–0.5)
Eosinophils Relative: 0 %
HCT: 41.7 % (ref 36.0–46.0)
Hemoglobin: 13.9 g/dL (ref 12.0–15.0)
Immature Granulocytes: 0 %
Lymphocytes Relative: 34 %
Lymphs Abs: 3.2 10*3/uL (ref 0.7–4.0)
MCH: 28 pg (ref 26.0–34.0)
MCHC: 33.3 g/dL (ref 30.0–36.0)
MCV: 84.1 fL (ref 80.0–100.0)
Monocytes Absolute: 0.6 10*3/uL (ref 0.1–1.0)
Monocytes Relative: 6 %
Neutro Abs: 5.6 10*3/uL (ref 1.7–7.7)
Neutrophils Relative %: 60 %
Platelet Count: 236 10*3/uL (ref 150–400)
RBC: 4.96 MIL/uL (ref 3.87–5.11)
RDW: 15.1 % (ref 11.5–15.5)
WBC Count: 9.4 10*3/uL (ref 4.0–10.5)
nRBC: 0 % (ref 0.0–0.2)

## 2022-06-03 ENCOUNTER — Ambulatory Visit (INDEPENDENT_AMBULATORY_CARE_PROVIDER_SITE_OTHER): Payer: Self-pay | Admitting: Neurology

## 2022-06-03 ENCOUNTER — Encounter: Payer: Self-pay | Admitting: Neurology

## 2022-06-03 DIAGNOSIS — Z91199 Patient's noncompliance with other medical treatment and regimen due to unspecified reason: Secondary | ICD-10-CM

## 2022-06-03 NOTE — Progress Notes (Signed)
PATIENT NO SHOWED HER APPOINTMENT TODAY; BELOW IS PRECHARTING INFO IF SHE CARES TO RESCHEDULE                     [facility]    Provider:  Dr Lucia Gaskins Requesting Provider: Darrow Bussing, MD Primary Care Provider:  Darrow Bussing, MD  CC:  occipital neuralgia  Update: We saw patient in 2023. MRI of the lumbar spine severe lumbar stenosis and she was sent to neurosurgery but never followed back up for her other complaints. Today she is her for a new chief complaint of memory loss.  Patient has a history of vitamin D deficient, impaired fasting glucose, lung cancer from July 2023 status postresection negative lymph nodes September 2023.  Patient had an MRI below that seem benign however I do not have the images yet so I cannot see how extensive the white matter changes that they mention are.  Asking for that.  I reviewed referral notes from Dr. Docia Chuck who says memory is worse, daughter says she forgets how to get to the airport, no confusion she forgets what she was told, her daughter has forgot to turn off the oven, has happened a few times, MMSE was 28 out of 30, MRI of the brain again as above, labs include the following: Hemoglobin A1c 5.8, thyroid normal, CMP unremarkable with BUN 16 and creatinine 0.8 CBC unremarkable these were drawn March 28, 2022.  Vitamin B12 was 211 in the past, which is something we will of instigate further. 12/31/2021   MRI brain 04/04/2022: Reviewed note, do not have access to [facility] images: FINDINGS:   There are multiple foci of T2 hyperintensity in the cerebral white matter. These do not have restricted diffusion. There is patchy T2 hyperintensity in the pons, more on the left, without restriction of diffusion.   The brain volume is normal. The hippocampi are normal and symmetric.   There is no acute or chronic infarct.   There is normal flow signal void in both internal carotid arteries and the basilar artery.   There  is no hemorrhage.   There is no mass lesion.   There is no hydrocephalus.   The bone marrow signal is normal.   The paranasal sinuses are clear.   Extracranial soft tissues are normal.    IMPRESSION:   There are multiple T2 hyperintensities in the cerebral white matter. These types of abnormalities are nonspecific and are of uncertain etiology or clinical significance. They are more common in older patients and patients with risk factors for vascular disease. Normal brain volume. Normal hippocampi.   HPI 09/24/2021:  Anna Roth is a 62 y.o. female here as requested by Darrow Bussing, MD for occipital neuralgia. PMHx Headache, I reviewed Dr. Dion Body notes, headaches on and off for a while now for few years, she thought it was coming from her neck, she had history of fusion of C-spine in 2002 has seen Dr. Lovell Sheehan last year who recommended surgery again given the findings, pain is in the back of the head occipital area feels tightness and denies any frontal headache, she is been a doctor at [facility] Dr. Arlyss Repress had stress testing done which apparently was normal, she may have a hypertension blood pressure appears a little elevated per his notes, she had carpal tunnel release in 2015, she does use cigarettes and is a current smoker 1 pack/day but no alcohol, limited caffeine, elevated blood pressure, frequent headaches and tobacco use as past  medical history.  His examination seemed to indicate occipital neuralgia possibly relation to prior neck surgery and she wonders about other alternatives for management, hoping neurologist can help with modalities other than surgical options Junkins noted.  And she is also working on smoking cessation.   She has pain in the neck but also pain in the left lower leg. The leg is actually worse and bothers her more. She was told she needs surgery. When she is walking leg stops moving, numb and tingly, it is significantly painful, she went to the ED a few weeks ago  for extreme pain. She couldn't walk, had to go to ED, she has bowel and bladder changes, we discussed options. Her neck is always tight and cracks. She sometimes has shooting paina nd numbness down the arm if she turns a certain way, she tries to do neck exercises. MRI lumbar spine: lumbar stenosis with claudication, left leg weakness, ongoing >2 years, was told had stenosis and needed surgery need to check and requesting imaging from [facility], worsening. Options include: PT (need new MRI first), medications(Gabapentin), Lidoderm patches (OTC), injections into the low back which would need to go back to [place] [facility] depending on results of MRI Lumbar spine. Gabapentin prn. Left leg flexion weakness, left AJ reduced, likely L5/s1 on the left which fits the distribution down the leg to the bottom of the feet..  Chronic neck pain, some occ radiculopathy, exam not worrisome for stenosis, will also add to PT, she denies anything sounding like occipital neuralgia, the pain goes down the arm and that's not really the issue she has a lot of neck tightness more between shoulder blades more myofascial cervical pain. Send to PT to help and dry needling and PT. Denies any pain at the occipital emergence but could be from c2-c4 degenerative changes (where the occipita nerve comes out) causing cervical myofascial pain. Reflexes normal, hoffman's negative, do not suspect cervical stenosis likely degenerative changes with cervical myofascial pain.  Saw Dr. Lovell Sheehan 2 years agoa nd was told she needs surgery, I do not have her images, requesting.  .  Reviewed notes, labs and imaging from outside physicians, which showed:  BMP 09/03/2021 normal  01/20/2020:  CT L Spine: Disc levels: L1-2: Mild disc bulging and moderate facet degeneration. No significant stenosis.   L2-3: Diffuse bulging of the disc and moderate to advanced facet degeneration. Mild spinal stenosis. Moderate right foraminal stenosis and mild left  foraminal stenosis.   L3-4: Moderate disc bulging and severe facet hypertrophy bilaterally. Moderate to severe spinal stenosis. Moderate subarticular and foraminal stenosis bilaterally.   L4-5: Moderate bulging of the disc with advanced facet degeneration bilaterally. Moderate spinal stenosis . Mild to moderate subarticular stenosis bilaterally.   L5-S1: Mild disc degeneration. Severe facet hypertrophy on the right causing right subarticular and foraminal stenosis. Mild facet degeneration on the left.   IMPRESSION: Multilevel degenerative change in the lumbar spine primarily involving the facet joints causing spinal and foraminal stenosis as described above  09/12/2021: PET IMPRESSION: 1. Right upper lobe nodule of concern on recent CTA chest is markedly hypermetabolic consistent with primary bronchogenic neoplasm. Low level hypermetabolism seen in the right hilum and associated with a small precarinal lymph node, indeterminate but close attention on follow-up recommended as metastatic disease not excluded. 2. 12 mm superficial subcutaneous nodule anterior left shoulder. This may be infectious/inflammatory and should be amenable to clinical inspection. 3. No unexpected or suspicious hypermetabolism in the neck, abdomen, or pelvis. 4.  Aortic Atherosclerois (  ICD10-170.0)  Review of Systems: Patient complains of symptoms per HPI as well as the following symptoms low back pain and neck pain. Pertinent negatives and positives per HPI. All others negative.

## 2022-11-11 NOTE — Progress Notes (Signed)
[facility] OFFICE PROGRESS NOTE  Koirala, Dibas, MD [address] [place] [address]  DIAGNOSIS:  Stage Ia (T1c, N0, M0) non-small cell lung cancer, squamous cell carcinoma presented with right upper lobe lung nodule. Diagnosed September 2023   PRIOR THERAPY: Status post right upper lobectomy with lymph node sampling under the care of Dr. Cliffton Asters on October 02, 2021.   CURRENT THERAPY: Observation   INTERVAL HISTORY: Anna Roth 62 y.o. female returns o the clinic today for a 86-month follow-up visit.  The patient was diagnosed with stage I non-small cell lung cancer in September 2023.  She underwent resection via lobectomy and has been on observation since that time.  She is feeling fairly well today.  Her CT scan incidentally noted a small pulmonary embolism.  The patient has a history of a PE approximately 8 years ago when she was living in [place].  She reports this was provoked after driving long distances to [place].  She was on what sounds like his Lovenox.  Most recently she denies any trauma, injuries, hospitalizations, or surgeries.  However she did just recently drive 7 hours from [place] for her mother's funeral on 10/30/2022.  She denies any leg swelling, erythema, or calf pain.  She denies any chest pain or dizziness.  She denies any hormone use.  She also had COVID-19 approximately 1 month ago.  She accidentally took naproxen today because she was unaware that this was an NSAID.  Her blood pressure is a little bit elevated today.  This was rechecked at and did not improve somewhat but is still not quite at goal.  She is on low-dose amlodipine at nighttime.  She took her medication as prescribed last night.  She does not check her blood pressure at home but is able to check her blood pressure at work.  She does not have any upcoming appointments with her family doctor.  Denies any vision changes, headaches, or dizziness.   She denies any  fever, chills, night sweats, or unexplained weight loss. Denies any nausea, vomiting, or diarrhea.  She does struggle with constipation.  She reports dyspnea on exertion with activity.  She mentions that she tires easily when she is active.  She had a cough when she had COVID 1 month ago.   Denies any hemoptysis.  She quit smoking when she was diagnosed with cancer. She denies any headache or visual changes.  She recently had a restaging CT scan performed.  She is here today for evaluation and to review her scan results.    MEDICAL HISTORY: Past Medical History:  Diagnosis Date   Arthritis    COPD (chronic obstructive pulmonary disease) (HCC)    Deep vein thrombophlebitis of leg (HCC)    H/O: Bell's palsy    History of Anemia    during pregnancy   Hypertension    Lung cancer (HCC) 2023   Pneumonia     ALLERGIES:  has No Known Allergies.  MEDICATIONS:  Current Outpatient Medications  Medication Sig Dispense Refill   amLODipine (NORVASC) 2.5 MG tablet Take 2.5 mg by mouth daily.     azithromycin (ZITHROMAX) 250 MG tablet TAKE 2 TABLETS (500 MG) BY ORAL ROUTE ONCE DAILY FOR 1 DAY THEN 1 TABLET (250 MG) BY ORAL ROUTE ONCE DAILY FOR 4 DAYS (Patient not taking: Reported on 05/13/2022)     clobetasol ointment (TEMOVATE) 0.05 % Apply 1 Application topically 2 (two) times daily as needed (rash/irritation.). (Patient not taking:  Reported on 05/13/2022)     Dupilumab (DUPIXENT) 300 MG/2ML SOPN Inject 300 mg into the skin every 14 (fourteen) days. (Patient not taking: Reported on 05/13/2022)     gabapentin (NEURONTIN) 300 MG capsule Take 1 capsule (300 mg total) by mouth 3 (three) times daily as needed. May take 600mg  at bedtime if needed. 90 capsule 11   ibuprofen (ADVIL) 200 MG tablet Take 200 mg by mouth every 8 (eight) hours as needed (pain.). (Patient not taking: Reported on 05/13/2022)     traMADol (ULTRAM) 50 MG tablet  (Patient not taking: Reported on 05/13/2022)     No current  facility-administered medications for this visit.    SURGICAL HISTORY:  Past Surgical History:  Procedure Laterality Date   BLADDER SUSPENSION     BRONCHIAL BIOPSY  09/10/2021   Procedure: BRONCHIAL BIOPSIES;  Surgeon: Josephine Igo, DO;  [facility];  Service: Pulmonary;;   BRONCHIAL NEEDLE ASPIRATION BIOPSY  09/10/2021   Procedure: BRONCHIAL NEEDLE ASPIRATION BIOPSIES;  Surgeon: Josephine Igo, DO;  [facility];  Service: Pulmonary;;   CARPAL TUNNEL RELEASE Bilateral    FINE NEEDLE ASPIRATION  09/10/2021   Procedure: FINE NEEDLE ASPIRATION;  Surgeon: Josephine Igo, DO;  [facility]: [facility];  Service: Pulmonary;;   INTERCOSTAL NERVE BLOCK Right 10/02/2021   Procedure: INTERCOSTAL NERVE BLOCK;  Surgeon: Corliss Skains, MD;  Location: [facility];  Service: Thoracic;  Laterality: Right;   neck fusion     x4   NODE DISSECTION Right 10/02/2021   Procedure: NODE DISSECTION;  Surgeon: Corliss Skains, MD;  [facility]: [facility];  Service: Thoracic;  Laterality: Right;   VIDEO BRONCHOSCOPY WITH ENDOBRONCHIAL ULTRASOUND Bilateral 09/10/2021   Procedure: VIDEO BRONCHOSCOPY WITH ENDOBRONCHIAL ULTRASOUND;  Surgeon: Josephine Igo, DO;  [facility];  Service: Pulmonary;  Laterality: Bilateral;    REVIEW OF SYSTEMS:   Review of Systems  Constitutional: Negative for appetite change, chills, fatigue, fever and unexpected weight change.  HENT: Negative for mouth sores, nosebleeds, sore throat and trouble swallowing.   Eyes: Negative for eye problems and icterus.  Respiratory: Positive for dyspnea on exertion. Negative for cough, hemoptysis,  and wheezing.   Cardiovascular: Negative for chest pain and leg swelling.  Gastrointestinal: Negative for abdominal pain, constipation, diarrhea, nausea and vomiting.  Genitourinary: Negative for bladder incontinence, difficulty urinating, dysuria, frequency and hematuria.   Musculoskeletal: Negative for back pain, gait  problem, neck pain and neck stiffness.  Skin: Negative for itching and rash.  Neurological: Negative for dizziness, extremity weakness, gait problem, headaches, light-headedness and seizures.  Hematological: Negative for adenopathy. Does not bruise/bleed easily.  Psychiatric/Behavioral: Negative for confusion, depression and sleep disturbance. The patient is not nervous/anxious.     PHYSICAL EXAMINATION:  There were no vitals taken for this visit.  ECOG PERFORMANCE STATUS: 0-1  Physical Exam  Constitutional: Oriented to person, place, and time and well-developed, well-nourished, and in no distress.  HENT:  Head: Normocephalic and atraumatic.  Mouth/Throat: Oropharynx is clear and moist. No oropharyngeal exudate.  Eyes: Conjunctivae are normal. Right eye exhibits no discharge. Left eye exhibits no discharge. No scleral icterus.  Neck: Normal range of motion. Neck supple.  Cardiovascular: Normal rate, regular rhythm, normal heart sounds and intact distal pulses.   Pulmonary/Chest: Effort normal and breath sounds normal. No respiratory distress. No wheezes. No rales.  Abdominal: Soft. Bowel sounds are normal. Exhibits no distension and no mass. There is no tenderness.  Musculoskeletal: Normal range of motion. Exhibits  no edema.  Lymphadenopathy:    No cervical adenopathy.  Neurological: Alert and oriented to person, place, and time. Exhibits normal muscle tone. Gait normal. Coordination normal.  Skin: Skin is warm and dry. No rash noted. Not diaphoretic. No erythema. No pallor.  Psychiatric: Mood, memory and judgment normal.  Vitals reviewed.  LABORATORY DATA: Lab Results  Component Value Date   WBC 9.4 05/13/2022   HGB 13.9 05/13/2022   HCT 41.7 05/13/2022   MCV 84.1 05/13/2022   PLT 236 05/13/2022      Chemistry      Component Value Date/Time   NA 138 05/13/2022 0744   NA 140 05/27/2016 1026   K 3.9 05/13/2022 0744   CL 103 05/13/2022 0744   CO2 26 05/13/2022 0744   BUN  19 05/13/2022 0744   BUN 9 05/27/2016 1026   CREATININE 0.86 05/13/2022 0744      Component Value Date/Time   CALCIUM 9.7 05/13/2022 0744   ALKPHOS 87 05/13/2022 0744   AST 28 05/13/2022 0744   ALT 29 05/13/2022 0744   BILITOT 0.6 05/13/2022 0744       RADIOGRAPHIC STUDIES:  No results found.   ASSESSMENT/PLAN:  This is a very pleasant 62 year old African-American female recently diagnosed with stage I (T1c, N0, M0) non-small cell lung cancer, squamous cell carcinoma.  She presented with a right upper lobe lung nodule status post right upper lobectomy with lymph node sampling under the care of Dr. Cliffton Asters on 10/02/2021.   She has been on observation since that time.   The patient recently had a restaging CT scan performed.  The patient was seen with Dr. Arbutus Ped today.  Dr. Arbutus Ped personally and independently reviewed the scan and discussed results with the patient today.    The scan did not show any evidence of disease progression.   We will see the patient back for follow-up visit in 6 months for evaluation and repeat CT scan of the chest at that time.   She will continue on observation.  Regarding her recent small PE, Dr. Arbutus Ped recommends continuing on Eliquis for at least 6 months.  We will revisit this at her next appointment and we should extend this.  I also discussed the importance of stopping in between travel to [place].  We also discussed signs and symptoms that would warrant emergency room evaluation such as chest pain, shortness of breath, dizziness, syncope, etc.  She was advised to avoid any NSAIDs including her naproxen.  Tylenol would be preferred.   I sent a 7-month supply of Eliquis to the pharmacy.  She knows not to pick up her maintenance dose pack until she completes her starter pack.  Her blood pressure was rechecked and was 153/70.  I asked that she monitor her blood pressure closely at home and keep a log of her readings.  If her blood pressure  continues to not be at goal, then she is advised to make a follow-up appointment with her PCP for dose adjustment of antihypertensives.   The patient was advised to call immediately if she has any concerning symptoms in the interval. The patient voices understanding of current disease status and treatment options and is in agreement with the current care plan. All questions were answered. The patient knows to call the clinic with any problems, questions or concerns. We can certainly see the patient much sooner if necessary   No orders of the defined types were placed in this encounter.   Anna Niday L Korbin Mapps, PA-C  11/11/22  ADDENDUM: Hematology/Oncology Attending:  I had a face-to-face encounter with the patient today.  I reviewed her records, lab, scan and recommended her care plan.  This is a very pleasant 62 years old African-American female with a stage Ia non-small cell lung cancer, squamous cell carcinoma diagnosed in September 2023 status post right upper lobectomy with lymph node sampling.  The patient has been on observation since that time and she is feeling fine. She had repeat CT scan of the chest performed recently.  I personally and independently reviewed the scan and discussed the result with the patient today. Her scan showed no concerning findings for disease recurrence or metastasis. I recommended for her to continue on observation with repeat CT scan of the chest in 6 months. The patient was advised to call immediately if she has any other concerning symptoms in the interval. The total time spent in the appointment was 20 minutes. Disclaimer: This note was dictated with voice recognition software. Similar sounding words can inadvertently be transcribed and may be missed upon review. Lajuana Matte, MD

## 2022-11-12 ENCOUNTER — Other Ambulatory Visit: Payer: Self-pay | Admitting: Physician Assistant

## 2022-11-12 ENCOUNTER — Telehealth: Payer: Self-pay

## 2022-11-12 ENCOUNTER — Ambulatory Visit (HOSPITAL_COMMUNITY)
Admission: RE | Admit: 2022-11-12 | Discharge: 2022-11-12 | Disposition: A | Discharge status: Home / Self Care | Payer: 59 | Source: Ambulatory Visit | Attending: Physician Assistant | Admitting: Physician Assistant

## 2022-11-12 ENCOUNTER — Inpatient Hospital Stay: Payer: 59 | Attending: Physician Assistant

## 2022-11-12 DIAGNOSIS — Z87891 Personal history of nicotine dependence: Secondary | ICD-10-CM | POA: Insufficient documentation

## 2022-11-12 DIAGNOSIS — Z86718 Personal history of other venous thrombosis and embolism: Secondary | ICD-10-CM | POA: Diagnosis not present

## 2022-11-12 DIAGNOSIS — Z79899 Other long term (current) drug therapy: Secondary | ICD-10-CM | POA: Insufficient documentation

## 2022-11-12 DIAGNOSIS — I2699 Other pulmonary embolism without acute cor pulmonale: Secondary | ICD-10-CM

## 2022-11-12 DIAGNOSIS — K5900 Constipation, unspecified: Secondary | ICD-10-CM | POA: Insufficient documentation

## 2022-11-12 DIAGNOSIS — C3490 Malignant neoplasm of unspecified part of unspecified bronchus or lung: Secondary | ICD-10-CM | POA: Insufficient documentation

## 2022-11-12 DIAGNOSIS — M129 Arthropathy, unspecified: Secondary | ICD-10-CM | POA: Insufficient documentation

## 2022-11-12 DIAGNOSIS — Z86711 Personal history of pulmonary embolism: Secondary | ICD-10-CM | POA: Diagnosis not present

## 2022-11-12 DIAGNOSIS — J449 Chronic obstructive pulmonary disease, unspecified: Secondary | ICD-10-CM | POA: Insufficient documentation

## 2022-11-12 DIAGNOSIS — I10 Essential (primary) hypertension: Secondary | ICD-10-CM | POA: Insufficient documentation

## 2022-11-12 DIAGNOSIS — C3411 Malignant neoplasm of upper lobe, right bronchus or lung: Secondary | ICD-10-CM | POA: Diagnosis present

## 2022-11-12 LAB — CBC WITH DIFFERENTIAL (CANCER CENTER ONLY)
Abs Immature Granulocytes: 0.03 10*3/uL (ref 0.00–0.07)
Basophils Absolute: 0.1 10*3/uL (ref 0.0–0.1)
Basophils Relative: 1 %
Eosinophils Absolute: 0.1 10*3/uL (ref 0.0–0.5)
Eosinophils Relative: 1 %
HCT: 44.5 % (ref 36.0–46.0)
Hemoglobin: 14.8 g/dL (ref 12.0–15.0)
Immature Granulocytes: 0 %
Lymphocytes Relative: 28 %
Lymphs Abs: 2.4 10*3/uL (ref 0.7–4.0)
MCH: 29.4 pg (ref 26.0–34.0)
MCHC: 33.3 g/dL (ref 30.0–36.0)
MCV: 88.5 fL (ref 80.0–100.0)
Monocytes Absolute: 0.6 10*3/uL (ref 0.1–1.0)
Monocytes Relative: 7 %
Neutro Abs: 5.6 10*3/uL (ref 1.7–7.7)
Neutrophils Relative %: 63 %
Platelet Count: 212 10*3/uL (ref 150–400)
RBC: 5.03 MIL/uL (ref 3.87–5.11)
RDW: 14 % (ref 11.5–15.5)
WBC Count: 8.8 10*3/uL (ref 4.0–10.5)
nRBC: 0 % (ref 0.0–0.2)

## 2022-11-12 LAB — CMP (CANCER CENTER ONLY)
ALT: 55 U/L — ABNORMAL HIGH (ref 0–44)
AST: 61 U/L — ABNORMAL HIGH (ref 15–41)
Albumin: 4.1 g/dL (ref 3.5–5.0)
Alkaline Phosphatase: 101 U/L (ref 38–126)
Anion gap: 13 (ref 5–15)
BUN: 10 mg/dL (ref 8–23)
CO2: 26 mmol/L (ref 22–32)
Calcium: 9.3 mg/dL (ref 8.9–10.3)
Chloride: 101 mmol/L (ref 98–111)
Creatinine: 0.72 mg/dL (ref 0.44–1.00)
GFR, Estimated: >60 mL/min (ref >60)
Glucose, Bld: 93 mg/dL (ref 70–99)
Potassium: 3.4 mmol/L — ABNORMAL LOW (ref 3.5–5.1)
Sodium: 140 mmol/L (ref 135–145)
Total Bilirubin: 0.9 mg/dL (ref 0.3–1.2)
Total Protein: 7.7 g/dL (ref 6.5–8.1)

## 2022-11-12 MED ORDER — IOHEXOL 300 MG/ML  SOLN
75.0000 mL | Freq: Once | INTRAMUSCULAR | Status: AC | PRN
  Administered 2022-11-12: 75 mL via INTRAVENOUS

## 2022-11-12 MED ORDER — APIXABAN (ELIQUIS) VTE STARTER PACK (10MG AND 5MG): ORAL_TABLET | ORAL | 0 refills | Status: AC

## 2022-11-12 MED ORDER — SODIUM CHLORIDE (PF) 0.9 % IJ SOLN
INTRAMUSCULAR | Status: AC
  Filled 2022-11-12: qty 50

## 2022-11-12 NOTE — Telephone Encounter (Signed)
Pt called per C. Heilingoetter, PA. Notified of small blood clot noted in lung today during CT scan. Informed that a blood thinner will be called in for her--will need to be sure to take daily and notify several days prior to needing refill so she does not skip any doses. Advised pt to go to the ED if she has any chest pain, shortness of breath, light-headedness/dizziness, or syncopal episodes. Instructed not to take NSAIDs while taking blood thinner--use Tylenol for pain. She verbalizes understanding.

## 2022-11-17 ENCOUNTER — Inpatient Hospital Stay (HOSPITAL_BASED_OUTPATIENT_CLINIC_OR_DEPARTMENT_OTHER): Payer: 59 | Admitting: Physician Assistant

## 2022-11-17 ENCOUNTER — Other Ambulatory Visit: Payer: Self-pay

## 2022-11-17 VITALS — BP 153/79 | HR 80 | Temp 98.0°F | Resp 17 | Ht 67.0 in | Wt 196.0 lb

## 2022-11-17 DIAGNOSIS — C3490 Malignant neoplasm of unspecified part of unspecified bronchus or lung: Secondary | ICD-10-CM

## 2022-11-17 DIAGNOSIS — I2699 Other pulmonary embolism without acute cor pulmonale: Secondary | ICD-10-CM | POA: Insufficient documentation

## 2022-11-17 DIAGNOSIS — C3411 Malignant neoplasm of upper lobe, right bronchus or lung: Secondary | ICD-10-CM | POA: Diagnosis not present

## 2022-11-17 MED ORDER — APIXABAN 5 MG PO TABS: 5.0000 mg | ORAL_TABLET | Freq: Two times a day (BID) | ORAL | 5 refills | Status: AC

## 2022-11-17 MED ORDER — APIXABAN 5 MG PO TABS: 5.0000 mg | ORAL_TABLET | Freq: Two times a day (BID) | ORAL | 2 refills | Status: DC

## 2023-02-12 ENCOUNTER — Other Ambulatory Visit: Payer: Self-pay

## 2023-02-12 DIAGNOSIS — C3490 Malignant neoplasm of unspecified part of unspecified bronchus or lung: Secondary | ICD-10-CM

## 2023-02-13 ENCOUNTER — Inpatient Hospital Stay (HOSPITAL_BASED_OUTPATIENT_CLINIC_OR_DEPARTMENT_OTHER): Payer: 59 | Admitting: Physician Assistant

## 2023-02-13 ENCOUNTER — Telehealth: Payer: Self-pay

## 2023-02-13 ENCOUNTER — Inpatient Hospital Stay: Payer: 59 | Attending: Physician Assistant

## 2023-02-13 VITALS — BP 159/86 | HR 87 | Temp 97.9°F | Resp 18 | Ht 67.0 in | Wt 191.8 lb

## 2023-02-13 DIAGNOSIS — C3411 Malignant neoplasm of upper lobe, right bronchus or lung: Secondary | ICD-10-CM | POA: Diagnosis not present

## 2023-02-13 DIAGNOSIS — M79602 Pain in left arm: Secondary | ICD-10-CM

## 2023-02-13 DIAGNOSIS — Z87891 Personal history of nicotine dependence: Secondary | ICD-10-CM | POA: Insufficient documentation

## 2023-02-13 DIAGNOSIS — R079 Chest pain, unspecified: Secondary | ICD-10-CM | POA: Insufficient documentation

## 2023-02-13 DIAGNOSIS — Z800 Family history of malignant neoplasm of digestive organs: Secondary | ICD-10-CM | POA: Diagnosis not present

## 2023-02-13 DIAGNOSIS — M5412 Radiculopathy, cervical region: Secondary | ICD-10-CM | POA: Diagnosis not present

## 2023-02-13 DIAGNOSIS — C3490 Malignant neoplasm of unspecified part of unspecified bronchus or lung: Secondary | ICD-10-CM

## 2023-02-13 LAB — CBC WITH DIFFERENTIAL (CANCER CENTER ONLY)
Abs Immature Granulocytes: 0.03 10*3/uL (ref 0.00–0.07)
Basophils Absolute: 0.1 10*3/uL (ref 0.0–0.1)
Basophils Relative: 1 %
Eosinophils Absolute: 0.1 10*3/uL (ref 0.0–0.5)
Eosinophils Relative: 1 %
HCT: 43.2 % (ref 36.0–46.0)
Hemoglobin: 14.5 g/dL (ref 12.0–15.0)
Immature Granulocytes: 0 %
Lymphocytes Relative: 32 %
Lymphs Abs: 3 10*3/uL (ref 0.7–4.0)
MCH: 28.9 pg (ref 26.0–34.0)
MCHC: 33.6 g/dL (ref 30.0–36.0)
MCV: 86.2 fL (ref 80.0–100.0)
Monocytes Absolute: 0.8 10*3/uL (ref 0.1–1.0)
Monocytes Relative: 9 %
Neutro Abs: 5.4 10*3/uL (ref 1.7–7.7)
Neutrophils Relative %: 57 %
Platelet Count: 268 10*3/uL (ref 150–400)
RBC: 5.01 MIL/uL (ref 3.87–5.11)
RDW: 12.9 % (ref 11.5–15.5)
WBC Count: 9.5 10*3/uL (ref 4.0–10.5)
nRBC: 0 % (ref 0.0–0.2)

## 2023-02-13 LAB — CMP (CANCER CENTER ONLY)
ALT: 28 U/L (ref 0–44)
AST: 26 U/L (ref 15–41)
Albumin: 4.3 g/dL (ref 3.5–5.0)
Alkaline Phosphatase: 97 U/L (ref 38–126)
Anion gap: 6 (ref 5–15)
BUN: 10 mg/dL (ref 8–23)
CO2: 29 mmol/L (ref 22–32)
Calcium: 10.1 mg/dL (ref 8.9–10.3)
Chloride: 104 mmol/L (ref 98–111)
Creatinine: 0.63 mg/dL (ref 0.44–1.00)
GFR, Estimated: >60 mL/min (ref >60)
Glucose, Bld: 96 mg/dL (ref 70–99)
Potassium: 4.1 mmol/L (ref 3.5–5.1)
Sodium: 139 mmol/L (ref 135–145)
Total Bilirubin: 0.4 mg/dL (ref 0.0–1.2)
Total Protein: 7.7 g/dL (ref 6.5–8.1)

## 2023-02-13 MED ORDER — METHYLPREDNISOLONE 4 MG PO TBPK: ORAL_TABLET | ORAL | 0 refills | Status: AC

## 2023-02-13 NOTE — Progress Notes (Signed)
Symptom Management Consult Note [facility]    Patient Care Team: Darrow Bussing, MD as PCP - General (Family Medicine)    Name / MRN / DOB: Anna Roth  [id number]  01-01-1961   Date of visit: 02/13/2023   Chief Complaint/Reason for visit: chest pain   Current Therapy: Observation   ASSESSMENT & PLAN: Patient is a 63 y.o. female with oncologic history of stage Ia (T1c, N0, M0) non-small cell lung cancer, squamous cell carcinoma followed by Dr. Arbutus Ped.  I have viewed most recent oncology note and lab work.    #Stage Ia (T1c, N0, M0) non-small cell lung cancer, squamous cell carcinoma  - Will follow up with medical oncologist in April 2025, Appointment request in process.   #Cervical Radiculopathy -Intermittent pain radiating from the neck to the left arm, associated with numbness and tingling in the fingers. Symptoms exacerbated by certain positions and movements. History of cervical spine fusion (C4-C5) in 1989-1990. Likely due to cervical spine changes or pinched nerve. Differential includes cervical spine degeneration and spinal stenosis.  -Discussed risks of NSAIDs with anticoagulation. Patient will discontinue ibuprofen use and take Tylenol for pain. - Engaged in shared decision making about getting an x-ray today of cervical spine. Patient prefers to follow up with neurosurgery over immediate imaging. - Prescribe Medrol Dosepak for inflammation - Encouraged patient to schedule appointment with neurosurgery for further work up of this pain. Provided neurosurgery contact information - Suggested using a heating pad for pain relief  Strict ED precautions discussed should symptoms worsen.  Heme/Onc History: Oncology History  Lung cancer (HCC)  10/02/2021 Initial Diagnosis   Lung cancer (HCC)   11/08/2021 Cancer Staging   Staging form: Lung, AJCC 8th Edition - Clinical: Stage IA3 (cT1c, cN0, cM0) - Signed by Si Gaul, MD on 11/08/2021        Interval history-: Discussed the use of AI scribe software for clinical note transcription with the patient, who gave verbal consent to proceed.   Anna Roth is a 63 y.o.  RHD female with oncologic history as above presenting [facility] today with chief complaint of chest pain. Patient presents unaccompanied to visit today.  Patient reports she is experiencing chest pain that has been worsening x 4 days. To describe her pain, patient states there is discomfort originating in the upper left chest region, radiating down the left arm. The discomfort, described as an ache, has been ongoing for approximately a week and has been disrupting the patient's sleep. The patient also reported intermittent numbness and tingling sensation in all fingers of the left hand. The patient has a history of cervical spine fusion surgery performed approximately three decades ago for similar symptoms. She states this current pain happens intermittently for over 1 year. Because it is still bothersome, she is seeking care today. The patient denied any recent heavy lifting or injury that could have precipitated the symptoms. 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  The patient's discomfort seems to be exacerbated by certain positions, particularly lying on the left side. The patient reported that stretching the arm and applying heat, such as during a hot shower, provides some relief. The patient has been self-managing the discomfort with over-the-counter 800 mg ibuprofen. The patient also has leftover tramadol from the time of her lung cancer surgery but has not used it for the current  symptoms.   ROS  All other systems are reviewed and are negative for acute change except as noted in the HPI.    No Known Allergies   Past Medical History:  Diagnosis Date   Arthritis     COPD (chronic obstructive pulmonary disease) (HCC)    Deep vein thrombophlebitis of leg (HCC)    H/O: Bell's palsy    History of Anemia    during pregnancy   Hypertension    Lung cancer (HCC) 2023   Pneumonia      Past Surgical History:  Procedure Laterality Date   BLADDER SUSPENSION     BRONCHIAL BIOPSY  09/10/2021   Procedure: BRONCHIAL BIOPSIES;  Surgeon: Josephine Igo, DO;  [facility];  Service: Pulmonary;;   BRONCHIAL NEEDLE ASPIRATION BIOPSY  09/10/2021   Procedure: BRONCHIAL NEEDLE ASPIRATION BIOPSIES;  Surgeon: Josephine Igo, DO;  [facility];  Service: Pulmonary;;   CARPAL TUNNEL RELEASE Bilateral    FINE NEEDLE ASPIRATION  09/10/2021   Procedure: FINE NEEDLE ASPIRATION;  Surgeon: Josephine Igo, DO;  [facility];  Service: Pulmonary;;   INTERCOSTAL NERVE BLOCK Right 10/02/2021   Procedure: INTERCOSTAL NERVE BLOCK;  Surgeon: Corliss Skains, MD;  [facility]: [facility];  Service: Thoracic;  Laterality: Right;   neck fusion     x4   NODE DISSECTION Right 10/02/2021   Procedure: NODE DISSECTION;  Surgeon: Corliss Skains, MD;  [facility]: [facility];  Service: Thoracic;  Laterality: Right;   VIDEO BRONCHOSCOPY WITH ENDOBRONCHIAL ULTRASOUND Bilateral 09/10/2021   Procedure: VIDEO BRONCHOSCOPY WITH ENDOBRONCHIAL ULTRASOUND;  Surgeon: Josephine Igo, DO;  [facility];  Service: Pulmonary;  Laterality: Bilateral;    Social History   Socioeconomic History   Marital status: Married    Spouse name: Not on file   Number of children: Not on file   Years of education: Not on file   Highest education level: Not on file  Occupational History   Not on file  Tobacco Use   Smoking status: Former    Current packs/day: 0.00    Average packs/day: 0.5 packs/day for 46.7 years (23.3 ttl pk-yrs)    Types: Cigarettes    Start date: 17    Quit date: 09/26/2021    Years since quitting: 1.3   Smokeless tobacco: Never  Vaping Use   Vaping  status: Never Used  Substance and Sexual Activity   Alcohol use: Yes    Alcohol/week: 21.0 standard drinks of alcohol    Types: 21 Shots of liquor per week   Drug use: Never   Sexual activity: Never  Other Topics Concern   Not on file  Social History Narrative   Lives at home with spouse, daughter   Right handed   Caffeine: 2 cups/day      ** Merged History Encounter **    Social Drivers of Corporate investment banker Strain: Not on file  Food Insecurity: No Food Insecurity (10/02/2021)   Hunger Vital Sign    Worried About Running Out of Food in the Last Year: Never true    Ran Out of Food in the Last Year: Never true  Transportation Needs: Not on file  Physical Activity: Not on file  Stress: Not on file  Social Connections: Unknown (06/11/2021)   [facility], [facility]   Social Network    Social Network: Not on file  Intimate Partner Violence: Unknown (05/15/2021)   Received from [facility], [facility]  HITS    Physically Hurt: Not on file    Insult or Talk Down To: Not on file    Threaten Physical Harm: Not on file    Scream or Curse: Not on file    Family History  Problem Relation Age of Onset   Diabetes Mother    Stroke Sister    Cancer Paternal Aunt        stomach   Cancer Maternal Grandmother        stomach   Cancer Paternal Grandmother        stomach      Current Outpatient Medications:    albuterol (VENTOLIN HFA) 108 (90 Base) MCG/ACT inhaler, Inhale 2 puffs every 4 hours by inhalation route for 90 days., Disp: , Rfl:    amLODipine (NORVASC) 2.5 MG tablet, Take 2.5 mg by mouth daily., Disp: , Rfl:    apixaban (ELIQUIS) 5 MG TABS tablet, Take 1 tablet (5 mg total) by mouth 2 (two) times daily., Disp: 60 tablet, Rfl: 5   azithromycin (ZITHROMAX) 250 MG tablet, , Disp: , Rfl:    gabapentin (NEURONTIN) 300 MG capsule, Take 1 capsule (300 mg total) by mouth 3 (three) times daily as needed. May take 600mg  at bedtime if needed., Disp:  90 capsule, Rfl: 11   ibuprofen (ADVIL) 200 MG tablet, Take 200 mg by mouth every 8 (eight) hours as needed (pain.)., Disp: , Rfl:    methylPREDNISolone (MEDROL DOSEPAK) 4 MG TBPK tablet, Take 6 pills by mouth day 1, 5 on day 2, 4 on day 3, 3 on day 4, 2 on day 5, 1 on day 6, Disp: 21 tablet, Rfl: 0   metoprolol succinate (TOPROL-XL) 25 MG 24 hr tablet, Take by mouth., Disp: , Rfl:    naproxen (NAPROSYN) 500 MG tablet, , Disp: , Rfl:    rosuvastatin (CRESTOR) 5 MG tablet, Take 5 mg by mouth daily., Disp: , Rfl:    traMADol (ULTRAM) 50 MG tablet, , Disp: , Rfl:    APIXABAN (ELIQUIS) VTE STARTER PACK (10MG  AND 5MG ), Take as directed on package: start with two-5mg  tablets twice daily for 7 days. On day 8, switch to one-5mg  tablet twice daily., Disp: 74 each, Rfl: 0   clobetasol ointment (TEMOVATE) 0.05 %, Apply 1 Application topically 2 (two) times daily as needed (rash/irritation.). (Patient not taking: Reported on 05/13/2022), Disp: , Rfl:    Dupilumab (DUPIXENT) 300 MG/2ML SOPN, Inject 300 mg into the skin every 14 (fourteen) days. (Patient not taking: Reported on 05/13/2022), Disp: , Rfl:   PHYSICAL EXAM: ECOG FS:1 - Symptomatic but completely ambulatory    Vitals:   02/13/23 0917 02/13/23 0918  BP: (!) 159/86 (!) 159/86  Pulse: 87 87  Resp: 16 18  Temp: 97.9 F (36.6 C) 97.9 F (36.6 C)  TempSrc: Oral Temporal  SpO2: 95% 95%  Weight: 191 lb 12.8 oz (87 kg) 191 lb 12.8 oz (87 kg)  Height:  5\' 7"  (1.702 m)   Physical Exam Vitals and nursing note reviewed.  Constitutional:      Appearance: She is not ill-appearing or toxic-appearing.  HENT:     Head: Normocephalic.  Eyes:     Conjunctiva/sclera: Conjunctivae normal.  Cardiovascular:     Rate and Rhythm: Normal rate and regular rhythm.     Pulses: Normal pulses.     Heart sounds: Normal heart sounds.  Pulmonary:     Effort: Pulmonary effort is normal.     Breath sounds: Normal breath sounds.  Abdominal:     General: There is  no distension.  Musculoskeletal:     Cervical back: Normal range of motion.  Skin:    General: Skin is warm and dry.  Neurological:     Mental Status: She is alert.        LABORATORY DATA: I have reviewed the data as listed    Latest Ref Rng & Units 02/13/2023    9:04 AM 11/12/2022    7:48 AM 05/13/2022    7:44 AM  CBC  WBC 4.0 - 10.5 K/uL 9.5  8.8  9.4   Hemoglobin 12.0 - 15.0 g/dL 60.7  37.1  06.2   Hematocrit 36.0 - 46.0 % 43.2  44.5  41.7   Platelets 150 - 400 K/uL 268  212  236         Latest Ref Rng & Units 02/13/2023    9:04 AM 11/12/2022    7:48 AM 05/13/2022    7:44 AM  CMP  Glucose 70 - 99 mg/dL 96  93  86   BUN 8 - 23 mg/dL 10  10  19    Creatinine 0.44 - 1.00 mg/dL 6.94  8.54  6.27   Sodium 135 - 145 mmol/L 139  140  138   Potassium 3.5 - 5.1 mmol/L 4.1  3.4  3.9   Chloride 98 - 111 mmol/L 104  101  103   CO2 22 - 32 mmol/L 29  26  26    Calcium 8.9 - 10.3 mg/dL 03.5  9.3  9.7   Total Protein 6.5 - 8.1 g/dL 7.7  7.7  8.0   Total Bilirubin 0.0 - 1.2 mg/dL 0.4  0.9  0.6   Alkaline Phos 38 - 126 U/L 97  101  87   AST 15 - 41 U/L 26  61  28   ALT 0 - 44 U/L 28  55  29        RADIOGRAPHIC STUDIES (from last 24 hours if applicable) I have personally reviewed the radiological images as listed and agreed with the findings in the report. No results found.      Visit Diagnosis: 1. Malignant neoplasm of upper lobe of right lung (HCC)   2. Pain of left upper extremity      No orders of the defined types were placed in this encounter.   All questions were answered. The patient knows to call the clinic with any problems, questions or concerns. No barriers to learning was detected.  A total of more than 30 minutes were spent on this encounter with face-to-face time and non-face-to-face time, including preparing to see the patient, ordering tests and/or medications, counseling the patient and coordination of care as outlined above.    Thank you for  allowing me to participate in the care of this patient.    Shanon Ace, PA-C Department of Hematology/Oncology [facility] Phone: [phone]  [phone]    02/13/2023 2:46 PM

## 2023-02-13 NOTE — Patient Instructions (Addendum)
[facility]  [address] [place]  [phone]   Please call thhe office to schedule an appointment. You can let them know that you are a patient of Dr. Lucia Gaskins.

## 2023-02-13 NOTE — Telephone Encounter (Signed)
Attempted to reach out to clarify current medications with patient. Patient had reported not taking the robaxin and steroid recently prescribed from urgent care visit 2 days ago. Wanted to clarify if patient had not picked them up or not, as patient was prescribed steroid pack in clinic today.  Patient will only need to take one of the steroids and will not require both. Left callback number and requested patient return call to clinic to verify.  Daphane Shepherd, PA-C aware.

## 2023-05-15 ENCOUNTER — Telehealth: Payer: Self-pay | Admitting: Internal Medicine

## 2023-05-15 NOTE — Telephone Encounter (Signed)
 Rescheduled appointments and the patient is aware.

## 2023-05-18 ENCOUNTER — Encounter (HOSPITAL_COMMUNITY): Payer: Self-pay

## 2023-05-18 ENCOUNTER — Ambulatory Visit (HOSPITAL_COMMUNITY)
Admission: RE | Admit: 2023-05-18 | Discharge: 2023-05-18 | Disposition: A | Discharge status: Home / Self Care | Source: Ambulatory Visit | Attending: Physician Assistant | Admitting: Physician Assistant

## 2023-05-18 DIAGNOSIS — C3490 Malignant neoplasm of unspecified part of unspecified bronchus or lung: Secondary | ICD-10-CM | POA: Insufficient documentation

## 2023-05-18 MED ORDER — SODIUM CHLORIDE (PF) 0.9 % IJ SOLN
INTRAMUSCULAR | Status: AC
  Filled 2023-05-18: qty 50

## 2023-05-18 MED ORDER — IOHEXOL 300 MG/ML  SOLN
75.0000 mL | Freq: Once | INTRAMUSCULAR | Status: AC | PRN
  Administered 2023-05-18: 75 mL via INTRAVENOUS

## 2023-05-22 NOTE — Progress Notes (Signed)
 [facility] OFFICE PROGRESS NOTE  Koirala, Dibas, MD [address] [place] [address]  DIAGNOSIS: Stage Ia (T1c, N0, M0) non-small cell lung cancer, squamous cell carcinoma presented with right upper lobe lung nodule. Diagnosed September 2023   PRIOR THERAPY: Status post right upper lobectomy with lymph node sampling under the care of Dr. Deloise Ferries on October 02, 2021.   CURRENT THERAPY: Observation   INTERVAL HISTORY: Anna Roth 63 y.o. female returns  to the clinic today for a 63-month follow-up visit.  The patient was diagnosed with stage I non-small cell lung cancer in September 2023.  She underwent resection via lobectomy and has been on observation since that time.  She is feeling fairly well today.   At her last appointment, her scan showed incidental PE. She had history of PE so we recommended eliquis  for at least 6 months and re-evaluating this at her visit today. Around the time of her most recent PE, she had recently drove 7 hours from [place] . She tolerates eliquis  well without any abnormal bleeding or bruising. The patient has a history of a PE approximately 8 years ago when she was living in [place] . She reports this was provoked after driving long distances to [place] . She was on what sounds like his Lovenox .   She denies any fever, chills, \or unexplained weight loss. She does have fatigue. She also has been having more frequent diarrhea for the last few months. She denies blood in the stool. She used to have history of constipation. Denies any nausea or vomiting. She reports stable dyspnea on exertion with activity. She sometimes has mild cough which she attributes to pollen. Denies any hemoptysis.  She quit smoking when she was diagnosed with cancer. She denies any headache or visual changes.  She recently had a restaging CT scan performed.  She is here today for evaluation and to review her scan results.   MEDICAL HISTORY: Past Medical  History:  Diagnosis Date   Arthritis    COPD (chronic obstructive pulmonary disease) (HCC)    Deep vein thrombophlebitis of leg (HCC)    H/O: Bell's palsy    History of Anemia    during pregnancy   Hypertension    Lung cancer (HCC) 2023   Pneumonia     ALLERGIES:  has no known allergies.  MEDICATIONS:  Current Outpatient Medications  Medication Sig Dispense Refill   albuterol  (VENTOLIN  HFA) 108 (90 Base) MCG/ACT inhaler Inhale 2 puffs every 4 hours by inhalation route for 90 days.     amLODipine (NORVASC) 2.5 MG tablet Take 2.5 mg by mouth daily.     apixaban  (ELIQUIS ) 5 MG TABS tablet Take 1 tablet (5 mg total) by mouth 2 (two) times daily. 60 tablet 5   APIXABAN  (ELIQUIS ) VTE STARTER PACK (10MG  AND 5MG ) Take as directed on package: start with two-5mg  tablets twice daily for 7 days. On day 8, switch to one-5mg  tablet twice daily. 74 each 0   azithromycin  (ZITHROMAX ) 250 MG tablet      clobetasol ointment (TEMOVATE) 0.05 % Apply 1 Application topically 2 (two) times daily as needed (rash/irritation.). (Patient not taking: Reported on 05/13/2022)     Dupilumab (DUPIXENT) 300 MG/2ML SOPN Inject 300 mg into the skin every 14 (fourteen) days. (Patient not taking: Reported on 05/13/2022)     gabapentin  (NEURONTIN ) 300 MG capsule Take 1 capsule (300 mg total) by mouth 3 (three) times daily as needed. May take 600mg  at bedtime if needed.  90 capsule 11   ibuprofen (ADVIL) 200 MG tablet Take 200 mg by mouth every 8 (eight) hours as needed (pain.).     methylPREDNISolone  (MEDROL  DOSEPAK) 4 MG TBPK tablet Take 6 pills by mouth day 1, 5 on day 2, 4 on day 3, 3 on day 4, 2 on day 5, 1 on day 6 21 tablet 0   metoprolol succinate (TOPROL-XL) 25 MG 24 hr tablet Take by mouth.     naproxen (NAPROSYN) 500 MG tablet      rosuvastatin (CRESTOR) 5 MG tablet Take 5 mg by mouth daily.     traMADol  (ULTRAM ) 50 MG tablet      No current facility-administered medications for this visit.    SURGICAL HISTORY:   Past Surgical History:  Procedure Laterality Date   BLADDER SUSPENSION     BRONCHIAL BIOPSY  09/10/2021   Procedure: BRONCHIAL BIOPSIES;  Surgeon: Prudy Brownie, DO;  Location: [facility];  Service: Pulmonary;;   BRONCHIAL NEEDLE ASPIRATION BIOPSY  09/10/2021   Procedure: BRONCHIAL NEEDLE ASPIRATION BIOPSIES;  Surgeon: Prudy Brownie, DO;  Location: [facility];  Service: Pulmonary;;   CARPAL TUNNEL RELEASE Bilateral    FINE NEEDLE ASPIRATION  09/10/2021   Procedure: FINE NEEDLE ASPIRATION;  Surgeon: Prudy Brownie, DO;  Location: [facility];  Service: Pulmonary;;   INTERCOSTAL NERVE BLOCK Right 10/02/2021   Procedure: INTERCOSTAL NERVE BLOCK;  Surgeon: Hilarie Lovely, MD;  Location: [facility] OR;  Service: Thoracic;  Laterality: Right;   neck fusion     x4   NODE DISSECTION Right 10/02/2021   Procedure: NODE DISSECTION;  Surgeon: Hilarie Lovely, MD;  Location: [facility];  Service: Thoracic;  Laterality: Right;   VIDEO BRONCHOSCOPY WITH ENDOBRONCHIAL ULTRASOUND Bilateral 09/10/2021   Procedure: VIDEO BRONCHOSCOPY WITH ENDOBRONCHIAL ULTRASOUND;  Surgeon: Prudy Brownie, DO;  Location: [facility];  Service: Pulmonary;  Laterality: Bilateral;    REVIEW OF SYSTEMS:   Constitutional: Positive for fatigue. Negative for appetite change, chills, fever and unexpected weight change.  HENT: Negative for mouth sores, nosebleeds, sore throat and trouble swallowing.   Eyes: Negative for eye problems and icterus.  Respiratory: Positive for dyspnea on exertion and intermittent cough. Negative for hemoptysis and wheezing.   Cardiovascular: Negative for chest pain and leg swelling.  Gastrointestinal: Positive for loose stools. Negative for abdominal pain, constipation, nausea and vomiting.  Genitourinary: Negative for bladder incontinence, difficulty urinating, dysuria, frequency and hematuria.   Musculoskeletal: Negative for back pain, gait problem, neck pain and neck stiffness.  Skin:  Negative for itching and rash.  Neurological: Negative for dizziness, extremity weakness, gait problem, headaches, light-headedness and seizures.  Hematological: Negative for adenopathy. Does not bruise/bleed easily.  Psychiatric/Behavioral: Negative for confusion, depression and sleep disturbance. The patient is not nervous/anxious.   PHYSICAL EXAMINATION:  Blood pressure (!) 155/73, pulse 90, temperature 97.6 F (36.4 C), temperature source Temporal, resp. rate 17, weight 196 lb 6.4 oz (89.1 kg), SpO2 95%.  ECOG PERFORMANCE STATUS: 0  Physical Exam  Constitutional: Oriented to person, place, and time and well-developed, well-nourished, and in no distress.  HENT:  Head: Normocephalic and atraumatic.  Mouth/Throat: Oropharynx is clear and moist. No oropharyngeal exudate.  Eyes: Conjunctivae are normal. Right eye exhibits no discharge. Left eye exhibits no discharge. No scleral icterus.  Neck: Normal range of motion. Neck supple.  Cardiovascular: Normal rate, regular rhythm, normal heart sounds and intact distal pulses.   Pulmonary/Chest: Effort normal. Quiet breath sounds bilaterally. No respiratory distress. No  wheezes. No rales.  Abdominal: Soft. Bowel sounds are normal. Exhibits no distension and no mass. There is no tenderness.  Musculoskeletal: Normal range of motion. Exhibits no edema.  Lymphadenopathy:    No cervical adenopathy.  Neurological: Alert and oriented to person, place, and time. Exhibits normal muscle tone. Gait normal. Coordination normal.  Skin: Skin is warm and dry. No rash noted. Not diaphoretic. No erythema. No pallor.  Psychiatric: Mood, memory and judgment normal.  Vitals reviewed.  LABORATORY DATA: Lab Results  Component Value Date   WBC 10.3 05/27/2023   HGB 14.4 05/27/2023   HCT 42.4 05/27/2023   MCV 84.8 05/27/2023   PLT 228 05/27/2023      Chemistry      Component Value Date/Time   NA 138 05/27/2023 1454   NA 140 05/27/2016 1026   K 3.5  05/27/2023 1454   CL 99 05/27/2023 1454   CO2 28 05/27/2023 1454   BUN 14 05/27/2023 1454   BUN 9 05/27/2016 1026   CREATININE 0.89 05/27/2023 1454      Component Value Date/Time   CALCIUM 9.6 05/27/2023 1454   ALKPHOS 110 05/27/2023 1454   AST 39 05/27/2023 1454   ALT 26 05/27/2023 1454   BILITOT 0.4 05/27/2023 1454       RADIOGRAPHIC STUDIES:  CT Chest W Contrast Result Date: 05/26/2023 CLINICAL DATA:  Non-small cell lung cancer, nonmetastatic, assess treatment response. * Tracking Code: BO * EXAM: CT CHEST WITH CONTRAST TECHNIQUE: Multidetector CT imaging of the chest was performed during intravenous contrast administration. RADIATION DOSE REDUCTION: This exam was performed according to the departmental dose-optimization program which includes automated exposure control, adjustment of the mA and/or kV according to patient size and/or use of iterative reconstruction technique. CONTRAST:  75mL OMNIPAQUE  IOHEXOL  300 MG/ML  SOLN COMPARISON:  Chest CT 11/12/2022 and 05/08/2022. FINDINGS: Cardiovascular: Mild atherosclerosis of the aorta, great vessels and coronary arteries. No acute vascular findings are identified. There is no evidence of recurrent acute pulmonary embolism. Stable mild central enlargement of the pulmonary arteries. Stable small varicosities of the right internal mammary vein. The heart size is normal. There is no pericardial effusion. Mediastinum/Nodes: There are no enlarged mediastinal, hilar or axillary lymph nodes. The thyroid gland, trachea and esophagus demonstrate no significant findings. Lungs/Pleura: No pleural effusion or pneumothorax. Stable postsurgical changes from previous right upper lobectomy with stable scarring along the suture line. No evidence of local recurrence or suspicious pulmonary nodularity. Mild underlying centrilobular emphysema. Upper abdomen: Low density throughout the liver consistent with steatosis. No acute findings. Musculoskeletal/Chest wall:  There is no chest wall mass or suspicious osseous finding. Previous lower cervical fusion. Mild multilevel thoracic spondylosis. IMPRESSION: 1. Stable postsurgical changes from previous right upper lobectomy. No evidence of local recurrence or metastatic disease. 2. No evidence of recurrent acute pulmonary embolism. 3. Hepatic steatosis. 4. Aortic Atherosclerosis (ICD10-I70.0) and Emphysema (ICD10-J43.9). Electronically Signed   By: Elmon Hagedorn M.D.   On: 05/26/2023 19:02     ASSESSMENT/PLAN:  This is a very pleasant 63 year old African-American female recently diagnosed with stage I (T1c, N0, M0) non-small cell lung cancer, squamous cell carcinoma.  She presented with a right upper lobe lung nodule status post right upper lobectomy with lymph node sampling under the care of Dr. Deloise Ferries on 10/02/2021.   She has been on observation since that time.   The patient recently had a restaging CT scan performed.  The patient was seen with Dr. Marguerita Shih today.  Dr. Marguerita Shih personally and  independently reviewed the scan and discussed results with the patient today.    The scan did not show any evidence of disease progression.   We will see the patient back for follow-up visit in 6 months for evaluation and repeat CT scan of the chest at that time.   She will continue on observation.   She has a history of PE x 2 both of which were after long distance travel.  Since both of these were provoked DVTs, Dr. Marguerita Shih recommends 6 months of anticoagulation which she recently completed.  However, if the patient ever has a PE or DVT in the future, we likely would recommend indefinite anticoagulation.  Encouraged her to follow-up with her gastroenterologist regarding her diarrhea or her PCP.   The patient was advised to call immediately if she has any concerning symptoms in the interval. The patient voices understanding of current disease status and treatment options and is in agreement with the current care  plan. All questions were answered. The patient knows to call the clinic with any problems, questions or concerns. We can certainly see the patient much sooner if necessary        Orders Placed This Encounter  Procedures   CT Chest W Contrast    Standing Status:   Future    Expected Date:   11/26/2023    Expiration Date:   05/26/2024    If indicated for the ordered procedure, I authorize the administration of contrast media per Radiology protocol:   Yes    Does the patient have a contrast media/X-ray dye allergy?:   No    Preferred imaging location?:   [facility]   CBC with Differential (Cancer Center Only)    Standing Status:   Future    Expected Date:   11/26/2023    Expiration Date:   05/26/2024   CMP (Cancer Center only)    Standing Status:   Future    Expected Date:   11/26/2023    Expiration Date:   05/26/2024      Leita Purdue Tiwana Chavis, PA-C 05/27/23  ADDENDUM: Hematology/Oncology Attending:  I had a face-to-face encounter with the patient today.  I reviewed her records, lab, scan and recommended her care plan.  This is a very pleasant 63 years old African-American female with a stage Ia non-small cell lung cancer, squamous cell carcinoma diagnosed in September 2023 status post right upper lobectomy with lymph node sampling.  The patient has been on observation with no concerning complaints.  She also has been on treatment with Eliquis  for incidental pulmonary embolism after travel from [place] . She had repeat CT scan of the chest performed recently.  I personally and independently reviewed the scan and discussed the result with the patient today.  Her scan showed no concerning finding for disease recurrence or metastasis. I recommended for the patient to continue on observation with repeat CT scan of the chest and 6 months.  Regarding the history of pulmonary embolism which was discovered incidentally after a long travel from [place]  in September 2020 for.   The patient has been on treatment with Eliquis  for 6 months and she can discontinue her treatment at this point but if she develops any other additional clotting disorder DVT or pulmonary embolus, she will need to stay on anticoagulation for life. The patient was also advised to be proactive and having breaks during travel and also to take an aspirin during any long travel. She was advised to call immediately if she has  any other concerning symptoms in the interval. The total time spent in the appointment was 20 minutes. Disclaimer: This note was dictated with voice recognition software. Similar sounding words can inadvertently be transcribed and may be missed upon review. Aurelio Blower, MD

## 2023-05-25 ENCOUNTER — Inpatient Hospital Stay: Payer: 59 | Admitting: Physician Assistant

## 2023-05-25 ENCOUNTER — Inpatient Hospital Stay: Payer: 59

## 2023-05-27 ENCOUNTER — Inpatient Hospital Stay (HOSPITAL_BASED_OUTPATIENT_CLINIC_OR_DEPARTMENT_OTHER): Admitting: Physician Assistant

## 2023-05-27 ENCOUNTER — Inpatient Hospital Stay: Attending: Physician Assistant

## 2023-05-27 VITALS — BP 155/73 | HR 90 | Temp 97.6°F | Resp 17 | Wt 196.4 lb

## 2023-05-27 DIAGNOSIS — Z86718 Personal history of other venous thrombosis and embolism: Secondary | ICD-10-CM | POA: Diagnosis not present

## 2023-05-27 DIAGNOSIS — Z902 Acquired absence of lung [part of]: Secondary | ICD-10-CM | POA: Diagnosis not present

## 2023-05-27 DIAGNOSIS — Z86711 Personal history of pulmonary embolism: Secondary | ICD-10-CM | POA: Diagnosis not present

## 2023-05-27 DIAGNOSIS — C3490 Malignant neoplasm of unspecified part of unspecified bronchus or lung: Secondary | ICD-10-CM

## 2023-05-27 DIAGNOSIS — C3411 Malignant neoplasm of upper lobe, right bronchus or lung: Secondary | ICD-10-CM | POA: Diagnosis present

## 2023-05-27 LAB — CBC WITH DIFFERENTIAL (CANCER CENTER ONLY)
Abs Immature Granulocytes: 0.04 10*3/uL (ref 0.00–0.07)
Basophils Absolute: 0 10*3/uL (ref 0.0–0.1)
Basophils Relative: 0 %
Eosinophils Absolute: 0.1 10*3/uL (ref 0.0–0.5)
Eosinophils Relative: 1 %
HCT: 42.4 % (ref 36.0–46.0)
Hemoglobin: 14.4 g/dL (ref 12.0–15.0)
Immature Granulocytes: 0 %
Lymphocytes Relative: 36 %
Lymphs Abs: 3.7 10*3/uL (ref 0.7–4.0)
MCH: 28.8 pg (ref 26.0–34.0)
MCHC: 34 g/dL (ref 30.0–36.0)
MCV: 84.8 fL (ref 80.0–100.0)
Monocytes Absolute: 0.8 10*3/uL (ref 0.1–1.0)
Monocytes Relative: 8 %
Neutro Abs: 5.6 10*3/uL (ref 1.7–7.7)
Neutrophils Relative %: 55 %
Platelet Count: 228 10*3/uL (ref 150–400)
RBC: 5 MIL/uL (ref 3.87–5.11)
RDW: 15.1 % (ref 11.5–15.5)
WBC Count: 10.3 10*3/uL (ref 4.0–10.5)
nRBC: 0 % (ref 0.0–0.2)

## 2023-05-27 LAB — CMP (CANCER CENTER ONLY)
ALT: 26 U/L (ref 0–44)
AST: 39 U/L (ref 15–41)
Albumin: 4.6 g/dL (ref 3.5–5.0)
Alkaline Phosphatase: 110 U/L (ref 38–126)
Anion gap: 11 (ref 5–15)
BUN: 14 mg/dL (ref 8–23)
CO2: 28 mmol/L (ref 22–32)
Calcium: 9.6 mg/dL (ref 8.9–10.3)
Chloride: 99 mmol/L (ref 98–111)
Creatinine: 0.89 mg/dL (ref 0.44–1.00)
GFR, Estimated: >60 mL/min (ref >60)
Glucose, Bld: 97 mg/dL (ref 70–99)
Potassium: 3.5 mmol/L (ref 3.5–5.1)
Sodium: 138 mmol/L (ref 135–145)
Total Bilirubin: 0.4 mg/dL (ref 0.0–1.2)
Total Protein: 7.9 g/dL (ref 6.5–8.1)

## 2023-07-14 ENCOUNTER — Other Ambulatory Visit: Payer: Self-pay | Admitting: Physician Assistant

## 2023-07-14 DIAGNOSIS — I2699 Other pulmonary embolism without acute cor pulmonale: Secondary | ICD-10-CM

## 2023-07-16 NOTE — Telephone Encounter (Signed)
 Attempted to contact patient regarding a refill request for Eliquis . Left voicemail asking if the refill request was initiated by the patient or the pharmacy.  Per Cassie, PA's last note, the patient has completed her 46-month course of Eliquis . However, if she develops any additional clotting disorders or another PE, lifelong anticoagulation therapy may be necessary. Requested a return call to confirm.

## 2023-11-02 ENCOUNTER — Institutional Professional Consult (permissible substitution): Admitting: Neurology

## 2023-11-26 ENCOUNTER — Ambulatory Visit (HOSPITAL_COMMUNITY)
Admission: RE | Admit: 2023-11-26 | Discharge: 2023-11-26 | Disposition: A | Discharge status: Home / Self Care | Source: Ambulatory Visit | Attending: Physician Assistant | Admitting: Physician Assistant

## 2023-11-26 ENCOUNTER — Inpatient Hospital Stay: Attending: Internal Medicine

## 2023-11-26 ENCOUNTER — Inpatient Hospital Stay

## 2023-11-26 ENCOUNTER — Encounter (HOSPITAL_COMMUNITY): Payer: Self-pay

## 2023-11-26 DIAGNOSIS — C3411 Malignant neoplasm of upper lobe, right bronchus or lung: Secondary | ICD-10-CM | POA: Diagnosis present

## 2023-11-26 DIAGNOSIS — C3490 Malignant neoplasm of unspecified part of unspecified bronchus or lung: Secondary | ICD-10-CM | POA: Insufficient documentation

## 2023-11-26 LAB — CBC WITH DIFFERENTIAL (CANCER CENTER ONLY)
Abs Immature Granulocytes: 0.02 K/uL (ref 0.00–0.07)
Basophils Absolute: 0.1 K/uL (ref 0.0–0.1)
Basophils Relative: 1 %
Eosinophils Absolute: 0.1 K/uL (ref 0.0–0.5)
Eosinophils Relative: 1 %
HCT: 44.7 % (ref 36.0–46.0)
Hemoglobin: 15 g/dL (ref 12.0–15.0)
Immature Granulocytes: 0 %
Lymphocytes Relative: 34 %
Lymphs Abs: 2.7 K/uL (ref 0.7–4.0)
MCH: 29.6 pg (ref 26.0–34.0)
MCHC: 33.6 g/dL (ref 30.0–36.0)
MCV: 88.3 fL (ref 80.0–100.0)
Monocytes Absolute: 0.7 K/uL (ref 0.1–1.0)
Monocytes Relative: 9 %
Neutro Abs: 4.4 K/uL (ref 1.7–7.7)
Neutrophils Relative %: 55 %
Platelet Count: 204 K/uL (ref 150–400)
RBC: 5.06 MIL/uL (ref 3.87–5.11)
RDW: 15.7 % — ABNORMAL HIGH (ref 11.5–15.5)
WBC Count: 7.9 K/uL (ref 4.0–10.5)
nRBC: 0 % (ref 0.0–0.2)

## 2023-11-26 LAB — CMP (CANCER CENTER ONLY)
ALT: 44 U/L (ref 0–44)
AST: 78 U/L — ABNORMAL HIGH (ref 15–41)
Albumin: 4.4 g/dL (ref 3.5–5.0)
Alkaline Phosphatase: 106 U/L (ref 38–126)
Anion gap: 11 (ref 5–15)
BUN: 9 mg/dL (ref 8–23)
CO2: 30 mmol/L (ref 22–32)
Calcium: 9.5 mg/dL (ref 8.9–10.3)
Chloride: 102 mmol/L (ref 98–111)
Creatinine: 0.7 mg/dL (ref 0.44–1.00)
GFR, Estimated: >60 mL/min (ref >60)
Glucose, Bld: 97 mg/dL (ref 70–99)
Potassium: 3.5 mmol/L (ref 3.5–5.1)
Sodium: 143 mmol/L (ref 135–145)
Total Bilirubin: 0.6 mg/dL (ref 0.0–1.2)
Total Protein: 7.7 g/dL (ref 6.5–8.1)

## 2023-11-26 MED ORDER — IOHEXOL 300 MG/ML  SOLN
75.0000 mL | Freq: Once | INTRAMUSCULAR | Status: AC | PRN
  Administered 2023-11-26: 75 mL via INTRAVENOUS

## 2023-11-26 MED ORDER — SODIUM CHLORIDE (PF) 0.9 % IJ SOLN
INTRAMUSCULAR | Status: AC
  Filled 2023-11-26: qty 50

## 2023-12-03 ENCOUNTER — Inpatient Hospital Stay: Attending: Internal Medicine | Admitting: Internal Medicine

## 2023-12-03 VITALS — BP 132/84 | HR 88 | Temp 98.0°F | Resp 17 | Ht 67.0 in | Wt 187.0 lb

## 2023-12-03 DIAGNOSIS — C3490 Malignant neoplasm of unspecified part of unspecified bronchus or lung: Secondary | ICD-10-CM

## 2023-12-03 NOTE — Progress Notes (Signed)
 [facility] [phone]   [phone]  OFFICE PROGRESS NOTE  Koirala, Dibas, MD [address] [place] [address]  DIAGNOSIS: Stage IA (T1c, N0, M0) non-small cell lung cancer, squamous cell carcinoma presented with right upper lobe lung nodule. Diagnosed September 2023    PRIOR THERAPY: Status post right upper lobectomy with lymph node sampling under the care of Dr. Shyrl on October 02, 2021.    CURRENT THERAPY: Observation   INTERVAL HISTORY: Anna Roth 63 y.o. female returns to the clinic today for 36-month follow-up visit. Discussed the use of AI scribe software for clinical note transcription with the patient, who gave verbal consent to proceed.  History of Present Illness Anna Roth is a 63 year old female with stage 1A squamous cell carcinoma of the lung who presents for evaluation with repeat CT scan of the chest for disease staging.  She was diagnosed with stage 1A squamous cell carcinoma of the lung in September 2023 and underwent a right upper lobectomy. Since the surgery, she has been on observation. No chest pain or breathing issues, although she has an occasional cough. She has experienced new gastrointestinal symptoms, including stomach problems, lack of appetite, and occasional diarrhea, over the past six months.  She experiences gastrointestinal symptoms, including a lack of appetite and occasional diarrhea, which seem to be associated with eating salads. Despite these symptoms, she has not experienced any weight loss.     MEDICAL HISTORY: Past Medical History:  Diagnosis Date   Arthritis    COPD (chronic obstructive pulmonary disease) (HCC)    Deep vein thrombophlebitis of leg (HCC)    H/O: Bell's palsy    History of Anemia    during pregnancy   Hypertension    Lung cancer (HCC) 2023   Pneumonia     ALLERGIES:  has no known allergies.  MEDICATIONS:  Current Outpatient Medications   Medication Sig Dispense Refill   albuterol  (VENTOLIN  HFA) 108 (90 Base) MCG/ACT inhaler Inhale 2 puffs every 4 hours by inhalation route for 90 days.     amLODipine (NORVASC) 2.5 MG tablet Take 2.5 mg by mouth daily.     apixaban  (ELIQUIS ) 5 MG TABS tablet Take 1 tablet (5 mg total) by mouth 2 (two) times daily. 60 tablet 5   APIXABAN  (ELIQUIS ) VTE STARTER PACK (10MG  AND 5MG ) Take as directed on package: start with two-5mg  tablets twice daily for 7 days. On day 8, switch to one-5mg  tablet twice daily. 74 each 0   azithromycin  (ZITHROMAX ) 250 MG tablet      clobetasol ointment (TEMOVATE) 0.05 % Apply 1 Application topically 2 (two) times daily as needed (rash/irritation.). (Patient not taking: Reported on 05/13/2022)     Dupilumab (DUPIXENT) 300 MG/2ML SOPN Inject 300 mg into the skin every 14 (fourteen) days. (Patient not taking: Reported on 05/13/2022)     gabapentin  (NEURONTIN ) 300 MG capsule Take 1 capsule (300 mg total) by mouth 3 (three) times daily as needed. May take 600mg  at bedtime if needed. 90 capsule 11   ibuprofen (ADVIL) 200 MG tablet Take 200 mg by mouth every 8 (eight) hours as needed (pain.).     methylPREDNISolone  (MEDROL  DOSEPAK) 4 MG TBPK tablet Take 6 pills by mouth day 1, 5 on day 2, 4 on day 3, 3 on day 4, 2 on day 5, 1 on day 6 21 tablet 0   metoprolol succinate (TOPROL-XL) 25 MG 24 hr tablet Take  by mouth.     naproxen (NAPROSYN) 500 MG tablet      rosuvastatin (CRESTOR) 5 MG tablet Take 5 mg by mouth daily.     traMADol  (ULTRAM ) 50 MG tablet      No current facility-administered medications for this visit.    SURGICAL HISTORY:  Past Surgical History:  Procedure Laterality Date   BLADDER SUSPENSION     BRONCHIAL BIOPSY  09/10/2021   Procedure: BRONCHIAL BIOPSIES;  Surgeon: Brenna Adine CROME, DO;  Location: [facility];  Service: Pulmonary;;   BRONCHIAL NEEDLE ASPIRATION BIOPSY  09/10/2021   Procedure: BRONCHIAL NEEDLE ASPIRATION BIOPSIES;  Surgeon: Brenna Adine CROME,  DO;  Location: [facility];  Service: Pulmonary;;   CARPAL TUNNEL RELEASE Bilateral    FINE NEEDLE ASPIRATION  09/10/2021   Procedure: FINE NEEDLE ASPIRATION;  Surgeon: Brenna Adine CROME, DO;  Location: [facility];  Service: Pulmonary;;   INTERCOSTAL NERVE BLOCK Right 10/02/2021   Procedure: INTERCOSTAL NERVE BLOCK;  Surgeon: Shyrl Linnie KIDD, MD;  Location: [facility] OR;  Service: Thoracic;  Laterality: Right;   neck fusion     x4   NODE DISSECTION Right 10/02/2021   Procedure: NODE DISSECTION;  Surgeon: Shyrl Linnie KIDD, MD;  Location: [facility] OR;  Service: Thoracic;  Laterality: Right;   VIDEO BRONCHOSCOPY WITH ENDOBRONCHIAL ULTRASOUND Bilateral 09/10/2021   Procedure: VIDEO BRONCHOSCOPY WITH ENDOBRONCHIAL ULTRASOUND;  Surgeon: Brenna Adine CROME, DO;  Location: [facility];  Service: Pulmonary;  Laterality: Bilateral;    REVIEW OF SYSTEMS:  A comprehensive review of systems was negative except for: Gastrointestinal: positive for diarrhea   PHYSICAL EXAMINATION: General appearance: alert, cooperative, and no distress Head: Normocephalic, without obvious abnormality, atraumatic Neck: no adenopathy, no JVD, supple, symmetrical, trachea midline, and thyroid not enlarged, symmetric, no tenderness/mass/nodules Lymph nodes: Cervical, supraclavicular, and axillary nodes normal. Resp: clear to auscultation bilaterally Back: symmetric, no curvature. ROM normal. No CVA tenderness. Cardio: regular rate and rhythm, S1, S2 normal, no murmur, click, rub or gallop GI: soft, non-tender; bowel sounds normal; no masses,  no organomegaly Extremities: extremities normal, atraumatic, no cyanosis or edema  ECOG PERFORMANCE STATUS: 1 - Symptomatic but completely ambulatory  Blood pressure 132/84, pulse 88, temperature 98 F (36.7 C), temperature source Temporal, resp. rate 17, height 5' 7 (1.702 m), weight 187 lb (84.8 kg), SpO2 95%.  LABORATORY DATA: Lab Results  Component Value Date   WBC 7.9 11/26/2023    HGB 15.0 11/26/2023   HCT 44.7 11/26/2023   MCV 88.3 11/26/2023   PLT 204 11/26/2023      Chemistry      Component Value Date/Time   NA 143 11/26/2023 0952   NA 140 05/27/2016 1026   K 3.5 11/26/2023 0952   CL 102 11/26/2023 0952   CO2 30 11/26/2023 0952   BUN 9 11/26/2023 0952   BUN 9 05/27/2016 1026   CREATININE 0.70 11/26/2023 0952      Component Value Date/Time   CALCIUM 9.5 11/26/2023 0952   ALKPHOS 106 11/26/2023 0952   AST 78 (H) 11/26/2023 0952   ALT 44 11/26/2023 0952   BILITOT 0.6 11/26/2023 0952       RADIOGRAPHIC STUDIES: CT Chest W Contrast Result Date: 11/27/2023 CLINICAL DATA:  Non-small cell lung cancer (NSCLC), non-metastatic, assess treatment response. * Tracking Code: BO * EXAM: CT CHEST WITH CONTRAST TECHNIQUE: Multidetector CT imaging of the chest was performed during intravenous contrast administration. RADIATION DOSE REDUCTION: This exam was performed according to the departmental dose-optimization program which includes automated  exposure control, adjustment of the mA and/or kV according to patient size and/or use of iterative reconstruction technique. CONTRAST:  75mL OMNIPAQUE  IOHEXOL  300 MG/ML  SOLN COMPARISON:  CT scan chest from 05/18/2023. FINDINGS: Cardiovascular: Normal cardiac size. No pericardial effusion. No aortic aneurysm. There are mild peripheral atherosclerotic vascular calcifications of thoracic aorta and its major branches. Mediastinum/Nodes: Visualized thyroid gland appears grossly unremarkable. No solid / cystic mediastinal masses. The esophagus is nondistended precluding optimal assessment. There are few mildly prominent mediastinal lymph nodes, which do not meet the size criteria for lymphadenopathy and appear grossly similar to the prior study, favoring benign etiology. No axillary or hilar lymphadenopathy by size criteria. Lungs/Pleura: The central tracheo-bronchial tree is patent. Postsurgical changes from prior right upper lobe lobectomy  noted. There is associated scarring in the right upper hemithorax, essentially unchanged. Mild right lung predominant compensatory emphysema noted. No mass or consolidation. No pleural effusion or pneumothorax. No suspicious lung nodules. Upper Abdomen: There is marked diffuse hepatic steatosis. Remaining visualized upper abdominal viscera within normal limits. Musculoskeletal: The visualized soft tissues of the chest wall are grossly unremarkable. No suspicious osseous lesions. There are mild multilevel degenerative changes in the visualized spine. C6-7 ACDF is noted. IMPRESSION: 1. No local recurrent or metastatic disease identified within the chest. Stable postsurgical changes from prior right upper lobectomy. 2. Multiple other nonacute observations, as described above. Aortic Atherosclerosis (ICD10-I70.0) and Emphysema (ICD10-J43.9). Electronically Signed   By: Ree Molt M.D.   On: 11/27/2023 08:58    ASSESSMENT AND PLAN: This is a very pleasant 63 years old African-American female with Stage Ia (T1c, N0, M0) non-small cell lung cancer, squamous cell carcinoma presented with right upper lobe lung nodule. Diagnosed September 2023  She is status post right upper lobectomy with lymph node sampling under the care of Dr. Shyrl on October 02, 2021.  The patient is currently on observation. She had repeat CT scan of the chest performed recently.  I personally independently reviewed the scan and discussed the result with the patient today.  Her scan showed no concerning findings for disease recurrence or metastasis.  Assessment and Plan Assessment & Plan Stage IA right upper lobe lung squamous cell carcinoma, post-lobectomy, under surveillance Stage IA right upper lobe lung squamous cell carcinoma, post right upper lobectomy, currently under surveillance. Recent CT scan shows no evidence of disease progression or metastasis. No new symptoms such as chest pain or significant respiratory issues.  Occasional cough present but not concerning. No weight loss reported despite gastrointestinal symptoms including lack of appetite and diarrhea, possibly related to dietary habits. - Continue annual surveillance with CT scan of the chest. She was advised to call immediately if she has any other concerning symptoms in the interval. The patient voices understanding of current disease status and treatment options and is in agreement with the current care plan.  All questions were answered. The patient knows to call the clinic with any problems, questions or concerns. We can certainly see the patient much sooner if necessary.  The total time spent in the appointment was 20 minutes including review of chart and various tests results, discussions about plan of care and coordination of care plan .   Disclaimer: This note was dictated with voice recognition software. Similar sounding words can inadvertently be transcribed and may not be corrected upon review.

## 2023-12-07 ENCOUNTER — Emergency Department (HOSPITAL_COMMUNITY)

## 2023-12-07 ENCOUNTER — Encounter (HOSPITAL_COMMUNITY): Payer: Self-pay

## 2023-12-07 ENCOUNTER — Telehealth: Payer: Self-pay | Admitting: Internal Medicine

## 2023-12-07 ENCOUNTER — Other Ambulatory Visit: Payer: Self-pay

## 2023-12-07 ENCOUNTER — Emergency Department (HOSPITAL_COMMUNITY)
Admission: EM | Admit: 2023-12-07 | Discharge: 2023-12-07 | Disposition: A | Discharge status: Home / Self Care | Attending: Emergency Medicine | Admitting: Emergency Medicine

## 2023-12-07 DIAGNOSIS — R079 Chest pain, unspecified: Secondary | ICD-10-CM | POA: Insufficient documentation

## 2023-12-07 DIAGNOSIS — M7918 Myalgia, other site: Secondary | ICD-10-CM | POA: Insufficient documentation

## 2023-12-07 DIAGNOSIS — G510 Bell's palsy: Secondary | ICD-10-CM | POA: Insufficient documentation

## 2023-12-07 DIAGNOSIS — R29810 Facial weakness: Secondary | ICD-10-CM

## 2023-12-07 LAB — BASIC METABOLIC PANEL WITH GFR
Anion gap: 13 (ref 5–15)
BUN: 8 mg/dL (ref 8–23)
CO2: 22 mmol/L (ref 22–32)
Calcium: 9 mg/dL (ref 8.9–10.3)
Chloride: 104 mmol/L (ref 98–111)
Creatinine, Ser: 0.62 mg/dL (ref 0.44–1.00)
GFR, Estimated: >60 mL/min (ref >60)
Glucose, Bld: 90 mg/dL (ref 70–99)
Potassium: 3.7 mmol/L (ref 3.5–5.1)
Sodium: 139 mmol/L (ref 135–145)

## 2023-12-07 LAB — CBC WITH DIFFERENTIAL/PLATELET
Abs Immature Granulocytes: 0.03 K/uL (ref 0.00–0.07)
Basophils Absolute: 0.1 K/uL (ref 0.0–0.1)
Basophils Relative: 1 %
Eosinophils Absolute: 0.1 K/uL (ref 0.0–0.5)
Eosinophils Relative: 1 %
HCT: 45.6 % (ref 36.0–46.0)
Hemoglobin: 15.2 g/dL — ABNORMAL HIGH (ref 12.0–15.0)
Immature Granulocytes: 0 %
Lymphocytes Relative: 31 %
Lymphs Abs: 3 K/uL (ref 0.7–4.0)
MCH: 29.8 pg (ref 26.0–34.0)
MCHC: 33.3 g/dL (ref 30.0–36.0)
MCV: 89.4 fL (ref 80.0–100.0)
Monocytes Absolute: 0.8 K/uL (ref 0.1–1.0)
Monocytes Relative: 8 %
Neutro Abs: 5.7 K/uL (ref 1.7–7.7)
Neutrophils Relative %: 59 %
Platelets: 224 K/uL (ref 150–400)
RBC: 5.1 MIL/uL (ref 3.87–5.11)
RDW: 14.8 % (ref 11.5–15.5)
WBC: 9.6 K/uL (ref 4.0–10.5)
nRBC: 0 % (ref 0.0–0.2)

## 2023-12-07 LAB — MAGNESIUM: Magnesium: 1.7 mg/dL (ref 1.7–2.4)

## 2023-12-07 LAB — TROPONIN I (HIGH SENSITIVITY)
Troponin I (High Sensitivity): 6 ng/L (ref <18)
Troponin I (High Sensitivity): 7 ng/L (ref <18)

## 2023-12-07 MED ORDER — AMLODIPINE BESYLATE 5 MG PO TABS
2.5000 mg | ORAL_TABLET | Freq: Once | ORAL | Status: AC
  Administered 2023-12-07: 2.5 mg via ORAL
  Filled 2023-12-07: qty 1

## 2023-12-07 MED ORDER — ACETAMINOPHEN 325 MG PO TABS
650.0000 mg | ORAL_TABLET | Freq: Once | ORAL | Status: AC
  Administered 2023-12-07: 650 mg via ORAL
  Filled 2023-12-07: qty 2

## 2023-12-07 MED ORDER — IBUPROFEN 400 MG PO TABS
600.0000 mg | ORAL_TABLET | Freq: Once | ORAL | Status: AC
  Administered 2023-12-07: 600 mg via ORAL
  Filled 2023-12-07: qty 1

## 2023-12-07 MED ORDER — PREDNISONE 10 MG PO TABS: ORAL_TABLET | ORAL | 0 refills | Status: AC

## 2023-12-07 MED ORDER — METHOCARBAMOL 500 MG PO TABS: 500.0000 mg | ORAL_TABLET | Freq: Two times a day (BID) | ORAL | 0 refills | Status: AC | PRN

## 2023-12-07 MED ORDER — GADOBUTROL 1 MMOL/ML IV SOLN
8.0000 mL | Freq: Once | INTRAVENOUS | Status: AC | PRN
  Administered 2023-12-07: 8 mL via INTRAVENOUS

## 2023-12-07 MED ORDER — PREDNISONE 20 MG PO TABS
60.0000 mg | ORAL_TABLET | Freq: Once | ORAL | Status: AC
  Administered 2023-12-07: 60 mg via ORAL
  Filled 2023-12-07: qty 3

## 2023-12-07 NOTE — ED Provider Notes (Signed)
 3:54 PM Patient signed out to me by previous ED physician. Pt is a 63 yo female presenting for left facial droop  today with left arm weakness/numbness that started on Friday.   CTH stable. MRI pending. Suspicion for Bells.   P: MRI results   Physical Exam  BP (!) 157/97 (BP Location: Right Arm)   Pulse 96   Temp 98.2 F (36.8 C)   Resp 17   Ht 5' 7 (1.702 m)   Wt 83.5 kg   SpO2 93%   BMI 28.82 kg/m   Physical Exam  Procedures  Procedures  ED Course / MDM    Medical Decision Making Amount and/or Complexity of Data Reviewed Radiology: ordered.  Risk OTC drugs. Prescription drug management.     MRI impression: 1. No acute intracranial abnormality. 2. Normal MRI of the internal auditory canals. 3. Age-related cerebral atrophy with moderate chronic microvascular ischemic disease.  Will suspect Bell's Palsy for unilateral facial droop. Steroids given in ED. No hx of tick bites or rashes/shingles on exam. No vision changes or ear pain. Rec f/u with pcp and/or neurology if symptoms persist past 2-3 weeks.   Patient in no distress and overall condition improved here in the ED. Detailed discussions were had with the patient regarding current findings, and need for close f/u with PCP or on call doctor. The patient has been instructed to return immediately if the symptoms worsen in any way for re-evaluation. Patient verbalized understanding and is in agreement with current care plan. All questions answered prior to discharge.\    Elnor Bernarda SQUIBB, DO 12/13/23 1510

## 2023-12-07 NOTE — ED Notes (Signed)
 Patient transported to MRI

## 2023-12-07 NOTE — ED Notes (Signed)
 Pt ambulated to restroom without distress.

## 2023-12-07 NOTE — ED Provider Triage Note (Signed)
 Emergency Medicine Provider Triage Evaluation Note  Anna Roth , a 63 y.o. female  was evaluated in triage.  Pt complains of here for evaluation of left-sided numbness and tingling which started Friday.  Some occasional shortness of breath and chest tightness.  Prior history of stroke however does have prior history of Bell's palsy in the left side of her face.  Here she has obvious left facial droop.  Tingling has improved to her left arms and legs.  Chest tightness will occasionally radiate into back and upper left arm. Prior lung CA with lobectomy  Review of Systems  Positive: Cp, numbness, facial droop Negative:   Physical Exam  BP (!) 157/97 (BP Location: Right Arm)   Pulse 96   Temp 98.2 F (36.8 C)   Resp 17   Ht 5' 7 (1.702 m)   Wt 83.5 kg   SpO2 93%   BMI 28.82 kg/m  Gen:   Awake, no distress   Resp:  Normal effort  Chest:  Clear non tender chest wall MSK:   Moves extremities without difficulty  Other:  Left facial droop including eyebrow, left lip.  Equal strength bilaterally intact sensation bilateral upper and lower extremities.  Medical Decision Making  Medically screening exam initiated at 1:30 PM.  Appropriate orders placed.  Tillman LITTIE Rase was informed that the remainder of the evaluation will be completed by another provider, this initial triage assessment does not replace that evaluation, and the importance of remaining in the ED until their evaluation is complete.  Numbness, facial droop, cp   Rashon Rezek A, PA-C 12/07/23 1332

## 2023-12-07 NOTE — ED Triage Notes (Signed)
 Patient reports left sided numbness and tingling since Friday, she also reports she is having chest pain and shob since Friday. She reports she has not had a stroke in the past. Patient has a left sided facial droop and left sided weakness on arrival to the ED. She reports left chest pain that radiates down her left arm and up the left side of her neck and back.

## 2023-12-07 NOTE — ED Notes (Signed)
 Patient rounded on to update on MRI. Patient on phone. RN informed patient that she would return. Patient acknowledged understanding.

## 2023-12-07 NOTE — ED Notes (Signed)
 Patient rounded and updated on plan of care and reasons for delays. Patient verbalized understanding. Patient resting in bed with regular, unlabored breathing. Patient denies other needs at this time. Call light within reach.

## 2023-12-07 NOTE — Telephone Encounter (Signed)
 Scheduled patient for next appointment. Called and spoke with the patient, she is aware.

## 2023-12-07 NOTE — ED Provider Notes (Signed)
 [place] EMERGENCY DEPARTMENT AT [facility] Provider Note   CSN: [id number] Arrival date & time: 12/07/23  1252     Patient presents with: stroke like symptoms., Chest Pain, and Shortness of Breath   Anna Roth is a 63 y.o. female.  {Add pertinent medical, surgical, social history, OB history to HPI:7631} 63 year old female with prior medical history as detailed below presents for evaluation.  Patient reports left-sided numbness and tingling in her arm.  She noticed the symptoms on Friday.  She did not notice her left-sided facial droop.  It is unclear how long she has had the left facial droop.  Patient with distant history of Bell's palsy on the right per her report.  She reports that she had Bell's palsy when she is 9.  She denies weakness in her extremities.  She is able to ambulate without difficulty.  The history is provided by the patient and medical records.       Prior to Admission medications   Medication Sig Start Date End Date Taking? Authorizing Provider  albuterol  (VENTOLIN  HFA) 108 (90 Base) MCG/ACT inhaler Inhale 2 puffs every 4 hours by inhalation route for 90 days. 05/18/20   [provider]  amLODipine (NORVASC) 2.5 MG tablet Take 2.5 mg by mouth daily.    [provider]  apixaban  (ELIQUIS ) 5 MG TABS tablet Take 1 tablet (5 mg total) by mouth 2 (two) times daily. 11/17/22   Heilingoetter, Cassandra L, PA-C  APIXABAN  (ELIQUIS ) VTE STARTER PACK (10MG  AND 5MG ) Take as directed on package: start with two-5mg  tablets twice daily for 7 days. On day 8, switch to one-5mg  tablet twice daily. 11/12/22   Heilingoetter, Cassandra L, PA-C  azithromycin  (ZITHROMAX ) 250 MG tablet  12/24/21   [provider]  clobetasol ointment (TEMOVATE) 0.05 % Apply 1 Application topically 2 (two) times daily as needed (rash/irritation.). Patient not taking: Reported on 05/13/2022 07/18/21   [provider]  Dupilumab (DUPIXENT) 300 MG/2ML SOPN  Inject 300 mg into the skin every 14 (fourteen) days. Patient not taking: Reported on 05/13/2022 02/05/21   [provider]  gabapentin  (NEURONTIN ) 300 MG capsule Take 1 capsule (300 mg total) by mouth 3 (three) times daily as needed. May take 600mg  at bedtime if needed. 09/24/21   Ines Onetha NOVAK, MD  ibuprofen (ADVIL) 200 MG tablet Take 200 mg by mouth every 8 (eight) hours as needed (pain.).    [provider]  methylPREDNISolone  (MEDROL  DOSEPAK) 4 MG TBPK tablet Take 6 pills by mouth day 1, 5 on day 2, 4 on day 3, 3 on day 4, 2 on day 5, 1 on day 6 02/13/23   Walisiewicz, Kaitlyn E, PA-C  metoprolol succinate (TOPROL-XL) 25 MG 24 hr tablet Take by mouth. 02/10/21   [provider]  naproxen (NAPROSYN) 500 MG tablet     [provider]  rosuvastatin (CRESTOR) 5 MG tablet Take 5 mg by mouth daily.    [provider]  traMADol  (ULTRAM ) 50 MG tablet     [provider]    Allergies: Patient has no known allergies.    Review of Systems  All other systems reviewed and are negative.   Updated Vital Signs BP (!) 157/97 (BP Location: Right Arm)   Pulse 96   Temp 98.2 F (36.8 C)   Resp 17   Ht 5' 7 (1.702 m)   Wt 83.5 kg   SpO2 93%   BMI 28.82 kg/m   Physical Exam  Vitals and nursing note reviewed.  Constitutional:      General: She is not in acute distress.    Appearance: She is well-developed.  HENT:     Head: Normocephalic and atraumatic.  Eyes:     Conjunctiva/sclera: Conjunctivae normal.  Cardiovascular:     Rate and Rhythm: Normal rate and regular rhythm.     Heart sounds: No murmur heard. Pulmonary:     Effort: Pulmonary effort is normal. No respiratory distress.     Breath sounds: Normal breath sounds.  Abdominal:     Palpations: Abdomen is soft.     Tenderness: There is no abdominal tenderness.  Musculoskeletal:        General: No swelling.     Cervical back: Neck supple.  Skin:    General: Skin is warm and dry.      Capillary Refill: Capillary refill takes less than 2 seconds.  Neurological:     Mental Status: She is alert and oriented to person, place, and time.     Comments: Alert and oriented x 4, normal speech, left facial droop, 5 out of 5 strength in both upper and lower extremities, normal gait  Psychiatric:        Mood and Affect: Mood normal.     (all labs ordered are listed, but only abnormal results are displayed) Labs Reviewed  CBC WITH DIFFERENTIAL/PLATELET - Abnormal; Notable for the following components:      Result Value   Hemoglobin 15.2 (*)    All other components within normal limits  BASIC METABOLIC PANEL WITH GFR  MAGNESIUM  TROPONIN I (HIGH SENSITIVITY)  TROPONIN I (HIGH SENSITIVITY)    EKG: None  Radiology: CT Head Wo Contrast Result Date: 12/07/2023 EXAM: CT HEAD WITHOUT CONTRAST 12/07/2023 02:19:40 PM TECHNIQUE: CT of the head was performed without the administration of intravenous contrast. Automated exposure control, iterative reconstruction, and/or weight based adjustment of the mA/kV was utilized to reduce the radiation dose to as low as reasonably achievable. COMPARISON: None available. CLINICAL HISTORY: Facial paralysis/weakness (CN 7) FINDINGS: BRAIN AND VENTRICLES: No acute hemorrhage. No evidence of acute infarct. No hydrocephalus. No extra-axial collection. No mass effect or midline shift. Scattered subcortical and periventricular white matter hypodensities, likely sequela of chronic small vessel ischemic disease. Mild atherosclerosis involving the carotid siphons. Consider dedicated contrast MRI for further evaluation of the facial nerves. ORBITS: No acute abnormality. SINUSES: No acute abnormality. SOFT TISSUES AND SKULL: No acute soft tissue abnormality. No skull fracture. Visualized portions of the temporal bones without focal abnormality. IMPRESSION: 1. No acute intracranial abnormality. 2. Chronic small vessel ischemic changes. 3. No focal abnormality in  the visualized portions of the temporal bones. Consider dedicated contrast-enhanced MRI brain/IAC for targeted evaluation of the facial nerves if clinically warranted. Electronically signed by: Donnice Mania MD 12/07/2023 02:55 PM EST RP Workstation: [id number]    {Document cardiac monitor, telemetry assessment procedure when appropriate:32947} Procedures   Medications Ordered in the ED - No data to display    {Click here for ABCD2, HEART and other calculators REFRESH Note before signing:1}                              Medical Decision Making Amount and/or Complexity of Data Reviewed Radiology: ordered.   ***  {Document critical care time when appropriate  Document review of labs and clinical decision tools ie CHADS2VASC2, etc  Document your independent review of radiology images and any outside  records  Document your discussion with family members, caretakers and with consultants  Document social determinants of health affecting pt's care  Document your decision making why or why not admission, treatments were needed:32947:::1}   Final diagnoses:  None    ED Discharge Orders     None

## 2023-12-07 NOTE — ED Notes (Signed)
 Pt remains in MRI

## 2024-11-21 ENCOUNTER — Inpatient Hospital Stay

## 2024-11-30 ENCOUNTER — Inpatient Hospital Stay: Admitting: Internal Medicine
